# Patient Record
Sex: Male | Born: 1950
Health system: Southern US, Community
[De-identification: ages and names within clinical notes are randomized; demographics above are authoritative.]

## PROBLEM LIST (undated history)

## (undated) DIAGNOSIS — I77810 Thoracic aortic ectasia: Secondary | ICD-10-CM

## (undated) DIAGNOSIS — I4510 Unspecified right bundle-branch block: Secondary | ICD-10-CM

## (undated) DIAGNOSIS — I10 Essential (primary) hypertension: Secondary | ICD-10-CM

## (undated) HISTORY — DX: Essential (primary) hypertension: I10

## (undated) HISTORY — DX: Unspecified right bundle-branch block: I45.10

## (undated) HISTORY — DX: Thoracic aortic ectasia: I77.810

---

## 1990-01-03 HISTORY — PX: NEPHRECTOMY: SHX65

## 2004-01-15 ENCOUNTER — Ambulatory Visit: Payer: Self-pay | Admitting: Cardiology

## 2004-01-15 ENCOUNTER — Inpatient Hospital Stay (HOSPITAL_COMMUNITY): Admission: AD | Admit: 2004-01-15 | Discharge: 2004-01-17 | Payer: Self-pay | Admitting: Family Medicine

## 2004-01-16 ENCOUNTER — Ambulatory Visit: Payer: Self-pay | Admitting: *Deleted

## 2004-01-27 ENCOUNTER — Ambulatory Visit: Payer: Self-pay | Admitting: *Deleted

## 2005-01-10 ENCOUNTER — Ambulatory Visit: Payer: Self-pay | Admitting: *Deleted

## 2005-01-13 ENCOUNTER — Ambulatory Visit (HOSPITAL_COMMUNITY): Admission: RE | Admit: 2005-01-13 | Discharge: 2005-01-13 | Payer: Self-pay | Admitting: *Deleted

## 2005-01-18 ENCOUNTER — Ambulatory Visit: Payer: Self-pay | Admitting: *Deleted

## 2007-03-01 ENCOUNTER — Ambulatory Visit: Payer: Self-pay | Admitting: Internal Medicine

## 2007-03-06 ENCOUNTER — Ambulatory Visit: Payer: Self-pay | Admitting: Cardiology

## 2007-03-06 ENCOUNTER — Ambulatory Visit (HOSPITAL_COMMUNITY): Admission: RE | Admit: 2007-03-06 | Discharge: 2007-03-06 | Payer: Self-pay | Admitting: Internal Medicine

## 2008-09-09 DIAGNOSIS — I10 Essential (primary) hypertension: Secondary | ICD-10-CM | POA: Insufficient documentation

## 2009-11-02 ENCOUNTER — Ambulatory Visit: Payer: Self-pay | Admitting: Cardiology

## 2009-11-02 DIAGNOSIS — E782 Mixed hyperlipidemia: Secondary | ICD-10-CM

## 2009-11-02 DIAGNOSIS — I712 Thoracic aortic aneurysm, without rupture, unspecified: Secondary | ICD-10-CM | POA: Insufficient documentation

## 2009-11-03 ENCOUNTER — Encounter: Payer: Self-pay | Admitting: Cardiology

## 2009-11-06 ENCOUNTER — Ambulatory Visit: Payer: Self-pay | Admitting: Cardiology

## 2009-11-06 ENCOUNTER — Encounter: Payer: Self-pay | Admitting: Cardiology

## 2009-11-06 ENCOUNTER — Ambulatory Visit (HOSPITAL_COMMUNITY): Admission: RE | Admit: 2009-11-06 | Discharge: 2009-11-06 | Payer: Self-pay | Admitting: Cardiology

## 2009-11-27 ENCOUNTER — Emergency Department (HOSPITAL_COMMUNITY): Admission: EM | Admit: 2009-11-27 | Discharge: 2009-11-28 | Payer: Self-pay | Admitting: Emergency Medicine

## 2010-02-04 NOTE — Letter (Signed)
Summary: [facility] MEDICAL RECORDS  [facility]   Imported By: Faythe Ghee 11/03/2009 12:26:32  _____________________________________________________________________  External Attachment:    Type:   Image     Comment:   External Document

## 2010-02-04 NOTE — Assessment & Plan Note (Signed)
Summary: **NP6 SURVEILLANCE ANEURYSM  Medications Added ATENOLOL 100 MG TABS (ATENOLOL) take 1 tab daily ALLOPURINOL 100 MG TABS (ALLOPURINOL) take 1 tab daily SIMVASTATIN 20 MG TABS (SIMVASTATIN) take 1 tab daily CLONIDINE HCL 0.1 MG TABS (CLONIDINE HCL) take 1 tab two times a day ADVIL 200 MG TABS (IBUPROFEN) use as needed      Allergies Added: NKDA  Visit Type:  Follow-up Primary Maveric Debono:  Dr. Elfredia Nevins   History of Present Illness: 60 year old [name] presents for followup. This is my first meeting with him. He was previously seen by Dr. Tenny Craw, last in February of 2009. He has a history of a very mildly dilated aortic root at 4 cm. This has been followed by both CT scan and echocardiography over the years. It appears that the last assessment was in 2007 - I can locate no more recect studies.  He has been symptomatically stable without any chest pain or unusual dyspnea. He works long hours [employer] with his wife. He reports having lipids followed by Dr. Carlena Sax, and tolerating his present medications. He is not exercising regularly. He does seem to watch his diet, although we did about carbohydrate restriction and weight loss on today.  He would prefer to hold off serial CT scaning related to the cost and radiation, although is willing to proceed if absolutely needed.  Current Medications (verified): 1)  Atenolol 100 Mg Tabs (Atenolol) .... Take 1 Tab Daily 2)  Allopurinol 100 Mg Tabs (Allopurinol) .... Take 1 Tab Daily 3)  Simvastatin 20 Mg Tabs (Simvastatin) .... Take 1 Tab Daily 4)  Clonidine Hcl 0.1 Mg Tabs (Clonidine Hcl) .... Take 1 Tab Two Times A Day 5)  Advil 200 Mg Tabs (Ibuprofen) .... Use As Needed  Allergies (verified): No Known Drug Allergies  Comments:  Nurse/Medical Assistant: patient brought med bottles stated all meds are correct  Past History:  Social History: Last updated: 09/09/2008 Full Time Married  Tobacco Use - No.    Alcohol Use - no Regular Exercise - no Drug Use - no  Past Medical History: Hypertension Renal cell carcinoma Dilated aortic root, 40 mm Right bundle branch block  Past Surgical History: Right nephrectomy 1992  Family History: Father: deceased age 64 myocardial infarction Mother: alive age 16 Siblings: 3 brothers alive and well, 3 sisters alive and well  Review of Systems  The patient denies anorexia, fever, weight loss, chest pain, syncope, dyspnea on exertion, peripheral edema, melena, and hematochezia.         Otherwise reviewed and negative.  Vital Signs:  Patient profile:   60 year old [name] Height:      66 inches Weight:      196 pounds BMI:     31.75 Pulse rate:   54 / minute BP sitting:   164 / 79  (right arm)  Vitals Entered By: Dreama Saa, CNA (November 02, 2009 2:36 PM)  Physical Exam  Additional Exam:  Overweight [name] in no acute distress. HEENT: Conjunctiva and lids normal, oropharynx clear. Neck: Supple, no elevated JVP or bruits, no thyromegaly. Lungs: Clear to auscultation, nonlabored. Cardiac: Regular rate and rhythm, no S3 or significant murmur.  Abdomen: Soft, nontender, bowel sounds present. Skin: Warm and dry. Musculoskeletal: No kyphosis. Extremities: No pitting edema, pulses full. Neuropsychiatric: Alert and oriented x3, affect appropriate.   Nuclear Study  Procedure date:  01/15/2006  Findings:       RESULTS:  There is uniform perfusion throughout all myocardial  segments.  There is no evidence of ischemia or scar.  There is some   diaphragmatic attenuation.  The wall motion appears normal throughout   including the inferior wall.  The overall ejection fraction is 55%.   STRESS TEST: The patient exercised 9 minutes and 9 seconds of a Bruce   protocol, attaining Edward mets of exercise.  His heart rate increased   from 87 beats per minute to 136 beats per minute, which is 81% of his   max predicted heart rate for his age.  His blood  pressure went from   148/90 to 212/78.  During that time, he experienced no chest pain and   no shortness of breath.  He had no arrhythmia or ST-T wave changes.   His baseline electrocardiogram has a right bundle branch block.   Reason for stopping the test was fatigue.   IMPRESSION:   This is a low risk scan in a patient with no known coronary artery   disease.  Obviously, there is submaximal exercise, but this is due to   the patient's beta blockade.  There is, however, an excellent double   product at 28,000 and my sense is that does not interfere with the   accuracy of this study.  CT Scan  Procedure date:  01/13/2005  Findings:      IMPRESSION:   1.  Stable appearance of ectasia of the ascending thoracic aorta to   between 3.8 and 4.1 cm as described.  No complications identified.   2.  Stable appearance of the chest without evidence of acute process   or metastatic disease status-post right nephrectomy.  EKG  Procedure date:  Edward/31/2011  Findings:      Sinus bradycardia at 54 beats per minute with old rght bundle branch block pattern.  Impression & Recommendations:  Problem # 1:  ASCENDING AORTIC ANEURYSM (ICD-441.2)  Patient with mild dilatation of the aortic root and ascending aorta based on previous CT scan and echocardiogram, generally ranging from 3.8-4.1 cm. This has not been a progressive process over time. Last assessment was in 2007 based on my record review. Our plan is to proceed with a 2-D echocardiogram for repeat measurements of the aortic root and ascending aorta. If significant changes are noted in comparison to previous measurements, then we can refer him for a CT angiogram. Otherwise we will continue ultrasound surveillance and followup in one year.  Problem # 2:  HYPERTENSION (ICD-401.9)  Blood pressure is up today. Continues on medically therapy. I also discussed sodium restriction and weight loss.  His updated medication list for this problem  includes:    Atenolol 100 Mg Tabs (Atenolol) .Marland Kitchen... Take 1 tab daily    Clonidine Hcl 0.1 Mg Tabs (Clonidine hcl) .Marland Kitchen... Take 1 tab two times a day  Problem # 3:  MIXED HYPERLIPIDEMIA (ICD-272.2)  Followed by Dr. Sherwood Gambler.  His updated medication list for this problem includes:    Simvastatin 20 Mg Tabs (Simvastatin) .Marland Kitchen... Take 1 tab daily  Other Orders: 2-D Echocardiogram (2D Echo)  Patient Instructions: 1)  Your physician recommends that you schedule a follow-up appointment in: 1 year 2)  Your physician recommends that you continue on your current medications as directed. Please refer to the Current Medication list given to you today. 3)  Your physician has requested that you have an echocardiogram.  Echocardiography is a painless test that uses sound waves to create images of your heart. It provides your doctor with information about the size  and shape of your heart and how well your heart's chambers and valves are working.  This procedure takes approximately one hour. There are no restrictions for this procedure.

## 2010-05-18 NOTE — Assessment & Plan Note (Signed)
[facility]                       [place] [facility]IOLOG[facility] OFFICE NOTE   NAME:SEMAANMychael, [name]                     MRN:          [id number]  DATE:03/01/2007                            DOB:          March 26, 1950    IDENTIFICATION:  The patient is a 60 year old gentleman who was  previously seen by Dr. Dionicio Stall.  He was last seen in cardiology  clinic back in January 2007.  He has a history of a very mildly dilated  aortic root at about 4-cm on CT scan.   Since seen, the patient is followed again by Dr. Sherwood Gambler.  He continues on  his antihypertensive.  He denies shortness of breath.  No chest pain.  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  He says he does  not have time to exercise.  He likes to have salted nuts before bed.   CURRENT MEDICATIONS:  1. Catapres 0.5 b.i.d.  2. Toprol XL 100 daily.  3. Simvastatin 20.   REVIEW OF SYSTEMS:  No chest pain.  Breathing is good.   PHYSICAL EXAMINATION:  GENERAL:  The patient is in no distress.  VITAL SIGNS:  Blood pressure on arrival 140/90, on my check 142/72,  pulse 68, weight 198.  NECK:  JVP is normal.  No bruits.  LUNGS:  Clear.  CARDIAC:  Regular rate and rhythm, S1 and S2, no S3, no murmurs.  ABDOMEN:  Benign, slightly obese.  EXTREMITIES:  No edema.   IMPRESSION:  1. Aortic aneurysm.  We will set up for [name] echocardiogram to evaluate      the root.  On review of his x-ray, there was a ectasia of the      ascending aorta and no more comments beyond that.  Hopefully, we      will get a good view of this.  I would not plan another CT for now.  2. Hypertension in fair control.  Would continue.  3. Health care maintenance.  On his current dose of simvastatin, HDL      is 41, LDL 74.  Would continue.  Encouraged him to lose some weight      if possible and also to stay active and exercise.  4. Follow up in 1 year.     Pricilla Riffle, MD, [facility]  Electronically Signed   PVR/MedQ  DD:  03/01/2007  DT: 03/02/2007  Job #: [id number]   cc:   Madelin Rear. Sherwood Gambler, MD

## 2010-05-21 NOTE — H&P (Signed)
[name], [name]              ACCOUNT NO.:  [id number]   MEDICAL RECORD NO.:  [id number]         PATIENT TYPE:  INP   LOCATION:  A216                          FACILITY:  [facility]   PHYSICIAN:  Kirk Ruths, M.D.DATE OF BIRTH:  27-Dec-1950   DATE OF ADMISSION:  DATE OF DISCHARGE:  LH                                HISTORY & PHYSICAL   CHIEF COMPLAINT:  Chest discomfort.   HISTORY OF PRESENT ILLNESS:  The patient is a 60 year old male who has  history of hypertension.  Presents to the office complaining of two-day  history of chest tingling, weakness in his legs and excessive burping.  In  the office he was found to be mildly hypertensive.  Gross exam was normal.  EKG showed a new bundle branch block significantly changed from his last EKG  five years ago.  The patient also has strong family history of coronary  artery disease.  He is a nonsmoker.  He is admitted for rule out myocardial  infarction.   PAST MEDICAL HISTORY:  He is status post right nephrectomy in 1992 for renal  cell carcinoma.  He has above mentioned hypertension.  He is allergic to no  medication.  He is on Atenolol 100 mg daily as well as clonidine 0.1 twice  daily.   REVIEW OF SYSTEMS:  Denies nausea or vomiting, diaphoresis.  He does state  that for some time now he has felt a poke in his left chest upon straining.   PHYSICAL EXAMINATION:  GENERAL:  Middle aged male in no severe distress.  VITAL SIGNS:  He is afebrile, pulse 74 regular, blood pressure  150/92,  respiratory rate 20 unlabored.  HEENT:  Tympanic membranes normal.  Pupils equal to light and accommodation.  Oropharynx benign.  NECK:  Supple without jugular venous distention, bruits or thyromegaly.  LUNGS:  Clear in all areas.  HEART:  Regular sinus rhythm without murmurs, rubs or gallops.  ABDOMEN:  Soft and nontender.  EXTREMITIES:  Without clubbing or cyanosis.  NEUROLOGIC:  Grossly intact.   ASSESSMENT:  1.  Chest discomfort with EKG  changes.  2.  Hypertension.  3.  History of renal cell carcinoma.     Will   WMM/MEDQ  D:  01/15/2004  T:  01/15/2004  Job:  [id number]

## 2010-05-21 NOTE — Procedures (Signed)
NAMERIGGIN, CUTTINO NO.:  [id number]   MEDICAL RECORD NO.:  [id number]          PATIENT TYPE:  INP   LOCATION:  A216                          FACILITY:  [facility]   PHYSICIAN:  Vida Roller, M.D.   DATE OF BIRTH:  Oct 23, 1950   DATE OF PROCEDURE:  DATE OF DISCHARGE:                                    STRESS TEST   EXERCISE MYOVIEW   HISTORY:  [name] is a 60 year old male with no known coronary disease,  atypical chest discomfort whose cardiac risk factors include family history  of hypertension and mildly elevated lipids.  His cardiac enzymes are  negative x3 for acute myocardial infarction.   His baseline EKG reveals a right bundle branch block at a rate of 56 beats  per minute in a sinus rhythm.  Blood pressure is 148/90.   Patient exercised for 9 minutes and 9 seconds to BRUCE protocol stage 3 in  10.1 mets.  Maximal heart rate achieved was 136 beats per minute which is  81% predicted maximum.   Blood pressure was 212/78 which resolved down to 162/80 in recovery.  EKG  revealed no ischemic changes.  He maintained a right bundle branch block  throughout.  No arrhythmias were noted.  Patient reported no chest  discomfort and no shortness of breath.  Exercise was stopped secondary to  fatigue.   Final images and results are pending MD review.     Amy   AB/MEDQ  D:  01/16/2004  T:  01/16/2004  Job:  [id number]

## 2010-05-21 NOTE — Consult Note (Signed)
[name], [name]              ACCOUNT NO.:  [id number]   MEDICAL RECORD NO.:  [id number]          PATIENT TYPE:  INP   LOCATION:  A216                          FACILITY:  [facility]   PHYSICIAN:  [name], M.D.  DATE OF BIRTH:  1950/11/20   DATE OF CONSULTATION:  DATE OF DISCHARGE:                                   CONSULTATION   REFERRING DOCTOR:  [name].   DATE OF CONSULTATION:  January 15, 2004.   HISTORY OF PRESENT ILLNESS:  A 60 year old gentleman enjoying generally  excellent health, admitted to the hospital with chest discomfort and a newly  noted right bundle branch block.  [name] has had hypertension for a  number of years that has generally been under excellent control.  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  He has never previously been  evaluated by a cardiologist nor had any cardiac testing.  He has no past  history of significant chest discomfort.  The current episode began the day  prior to admission when he noted intermittent chest tingling and burning,  localized over the left anterior chest.  Each sensation lasted a matter of  seconds.  These waxed and waned for approximately 1-1/2 hours.  He also  noted the sensation of weakness in his legs, although he could walk normally  and pursue all of his usual activities.  He had associated eructation,  prompting him to use antacids.  He also took aspirin because of the location  of the chest discomfort and concern that he might be experiencing cardiac  problems.  His plan was to wait overnight to see how he felt in the morning.  When he had continued weakness in his legs this morning, he sought medical  attention in his physician's office.  On EKG, right bundle branch block was  present.  This had not been noted on a prior tracing from 2001.  He was  admitted for further evaluation.   There is no history diabetes.  The  patient is unaware of his lipid status.  He has not used tobacco products.   CURRENT MEDICATIONS:  1.  Atenolol 100 mg q.d.  2.  Clonidine 0.1 mg q.d.   There are no known allergies.   PAST MEDICAL HISTORY:  Notable for a nephrectomy in 1992 for neoplastic  disease performed at [facility].  Approximately 1 year ago he  suffered an episode of discomfort in his right foot that was characterized  as gout that responded to nonsteroidal medication.   SOCIAL HISTORY:  Lives in [place].  Denies excessive use of alcohol.  Married with one son and daughter who are healthy.   FAMILY HISTORY:  Mother is alive with hypertension.  The father died from  coronary disease after first requiring CABG surgery in his 58s.  He has six  siblings, none with coronary disease.   REVIEW OF SYSTEMS:  Notable for myopia requiring corrective lenses and some  minor GERD symptoms in the past.  All other systems reviewed and are  negative.   PHYSICAL EXAMINATION:  GENERAL APPEARANCE:  Pleasant, well-appearing  gentleman.  VITAL SIGNS:  Temperature 98, heart rate 60 and regular, respirations 18,  blood pressure 145/90.  Weight 188.  HEENT:  Anicteric sclerae.  Normal lids and conjunctivae.  Pupils are equal,  round and reactive to light.  NECK:  No jugular venous distention.  No carotid bruits.  ENDOCRINE:  No thyromegaly.  HEMATOPOIETIC:  No adenopathy.  LUNGS:  Clear.  CARDIAC:  Normal first and second heart sounds.  Fourth heart sound present.  ABDOMEN:  Soft and nontender.  Normal bowel sounds.  No bruits.  No  organomegaly.  EXTREMITIES:  Normal distal pulses.  No edema.  NEUROMUSCULAR:  Symmetric strength and tone.  Normal reflexes in the lower  extremities.  MUSCULOSKELETAL:  No joint deformity.   EKG:  Normal sinus rhythm.  Right bundle branch block.  Inferior J point  elevation.   Other laboratory is normal except for total CPK of 362.  However, MB and  troponin are normal.    IMPRESSION:  [name] has moderate cardiovascular risk with a history of  hypertension, a family history of early atherosclerosis and a newly noted  right bundle branch block.  His current symptoms are atypical and probably  of GI origin.  The etiology of his subjective lower extremity weakness is  uncertain.  If his renal tumor was a hypernephroma, a CT scan of the spine  might be appropriate to rule out recurrent disease.  From a cardiac  standpoint, an echocardiogram will be obtained to exclude prior inferior  myocardial infarction in light of his EKG abnormalities.  Serial cardiac  markers and EKGs will be obtained.  If negative, we will proceed with a  stress Myoview study in the morning.  If negative, no further cardiac  testing is anticipated.   We greatly appreciate referral of this very nice gentleman and will be happy  to follow him with you.     Robe   RR/MEDQ  D:  01/15/2004  T:  01/15/2004  Job:  [id number]

## 2010-05-21 NOTE — Procedures (Signed)
NAMECHETAN, MEHRING              ACCOUNT NO.:  [id number]   MEDICAL RECORD NO.:  [id number]          PATIENT TYPE:  INP   LOCATION:  A216                          FACILITY:  [facility]   PHYSICIAN:  Vida Roller, M.D.   DATE OF BIRTH:  10/14/1950   DATE OF PROCEDURE:  01/16/2004  DATE OF DISCHARGE:                                  ECHOCARDIOGRAM   TAPE NUMBER:  [id number].   TAPE COUNT:  [id number].   CLINICAL INFORMATION:  This is a gentleman, 60 years old with chest pain and  an abnormal electrocardiogram.   TECHNICAL QUALITY:  The technical quality of this study was difficulty.   M-MODE TRACINGS:  1.  The aorta is 30 mm.  2.  The left atrium is 45 mm.  3.  The septum is 13 mm.  4.  The posterior wall is 12 mm.  5.  The left ventricular diastolic dimension is 44 mm.  6.  The left ventricular systolic dimension is 30 mm.   2-D AND DOPPLER IMAGING:  1.  The left ventricle is normal size with normal systolic function.  There      are no obvious wall motion abnormal ties.  There is mild concentric left      ventricular hypertrophy.  2.  The right ventricle is normal size with normal systolic function.  3.  The left atrium is enlarged, the right atrium appears to be normal size.  4.  The aortic valve is sclerotic with no stenosis or regurgitation.  5.  The mitral valve has very mild annular calcification with trace      insufficiency.  6.  The tricuspid valve has mild insufficiency.  7.  Pulmonic valve not well seen.  8.  No pericardial effusion.  9.  Inferior vena cava not well seen.  10. The ascending aorta and the aortic root in the parasternal long axis      view appears to be dilated distal to the sinuses of Valsalva.  The      suprasternal notch views are difficult to interpret as they are      nonstandard, but there does not appear to be any dilatation beyond the      great vessels and there is no evidence of a coarctation.   RECOMMENDATIONS:  Consideration should be made,  in this gentleman with chest  pain, and an abnormal EKG for a CT scan of his chest with specific attention  to the aorta.     Trey Paula   JH/MEDQ  D:  01/16/2004  T:  01/16/2004  Job:  [id number]

## 2010-05-21 NOTE — Discharge Summary (Signed)
[name], [name]              ACCOUNT NO.:  [id number]   MEDICAL RECORD NO.:  [id number]          PATIENT TYPE:  INP   LOCATION:  A216                          FACILITY:  [facility]   PHYSICIAN:  [name], M.D.    DATE OF BIRTH:  1950/07/21   DATE OF ADMISSION:  01/15/2004  DATE OF DISCHARGE:  01/14/2006LH                                 DISCHARGE SUMMARY   DISCHARGE DIAGNOSES:  1.  Chest pain, questionable etiology, resolved.  2.  Mild dilation of aortic root, approximately 4 cm, no dissection.  3.  Cardiolite negative.  4.  History of renal cell carcinoma.  5.  Longstanding hypertension.   For details regarding admission, please refer to the admit note. Briefly,  this 60 year old male with the above history presented to the office with a  2 day history of chest tingling, weakness in his legs, as well as  irritation. He was found to be mildly hypertensive. Physical exam benign.  EKG showed new left bundle branch block, which was changed from EKG 5 years  prior. He does have a family history of coronary artery disease. He was  admitted for rule out of myocardial infarction. Further evaluation there as  indicated.   COURSE IN THE HOSPITAL:  The patient treated routinely on 2A. Rule out MI  protocol was benign. Cardiology consult was obtained. Cardiolite negative.  Echocardiogram benign except for questionable dilation of the aortic root. A  CT scan was obtained, which revealed max diameter of the ascending aorta for  approximately 4 cm. There was no dissection noted.   The patient's symptoms have completely resolved, he has remained stable  without complications and will be discharged today.   DISPOSITION:  His meds include Catapres 0.1 mg p.o. b.i.d., Toprol XL 100 q.  day, and p.r.n. Xanax.  He will be followed and treated expectedly as an  outpatient.  Dr. Dorethea [name] will follow and arrangements will be made for an  appointment next week.      MC/MEDQ  D:  01/17/2004  T:   01/17/2004  Job:  [id number]   cc:   [name], M.D.  Fax: [phone]

## 2010-12-29 ENCOUNTER — Encounter: Payer: Self-pay | Admitting: Cardiology

## 2012-04-12 ENCOUNTER — Encounter: Payer: Self-pay | Admitting: *Deleted

## 2012-04-13 ENCOUNTER — Encounter: Payer: Self-pay | Admitting: Cardiovascular Disease

## 2012-04-13 ENCOUNTER — Ambulatory Visit (HOSPITAL_COMMUNITY)
Admission: RE | Admit: 2012-04-13 | Discharge: 2012-04-13 | Disposition: A | Discharge status: Home / Self Care | Payer: BC Managed Care – PPO | Source: Ambulatory Visit | Attending: Cardiovascular Disease | Admitting: Cardiovascular Disease

## 2012-04-13 ENCOUNTER — Ambulatory Visit (INDEPENDENT_AMBULATORY_CARE_PROVIDER_SITE_OTHER): Payer: Medicare Other | Admitting: Cardiovascular Disease

## 2012-04-13 VITALS — BP 156/88 | HR 54 | Ht 66.0 in | Wt 202.0 lb

## 2012-04-13 DIAGNOSIS — R011 Cardiac murmur, unspecified: Secondary | ICD-10-CM | POA: Insufficient documentation

## 2012-04-13 DIAGNOSIS — I712 Thoracic aortic aneurysm, without rupture: Secondary | ICD-10-CM

## 2012-04-13 DIAGNOSIS — E785 Hyperlipidemia, unspecified: Secondary | ICD-10-CM | POA: Insufficient documentation

## 2012-04-13 DIAGNOSIS — I359 Nonrheumatic aortic valve disorder, unspecified: Secondary | ICD-10-CM

## 2012-04-13 DIAGNOSIS — E782 Mixed hyperlipidemia: Secondary | ICD-10-CM

## 2012-04-13 DIAGNOSIS — I350 Nonrheumatic aortic (valve) stenosis: Secondary | ICD-10-CM

## 2012-04-13 DIAGNOSIS — I4510 Unspecified right bundle-branch block: Secondary | ICD-10-CM | POA: Insufficient documentation

## 2012-04-13 DIAGNOSIS — I10 Essential (primary) hypertension: Secondary | ICD-10-CM

## 2012-04-13 DIAGNOSIS — I369 Nonrheumatic tricuspid valve disorder, unspecified: Secondary | ICD-10-CM

## 2012-04-13 NOTE — Assessment & Plan Note (Signed)
Well controlled.  Continue current medications and low sodium Dash type diet.    

## 2012-04-13 NOTE — Assessment & Plan Note (Signed)
Single kidney Would be nice to avoid CT/dye.  Can assess aorta with cardiac MRI.  No contrast or iv needed use axial IIR images and Fiesta candycane images for assessment and measurement Will discuss with tech

## 2012-04-13 NOTE — Assessment & Plan Note (Signed)
In setting of ascending aortic dilatation check echo for AV disease  Previous EF also noted to be 50-55% reevaluate

## 2012-04-13 NOTE — Progress Notes (Signed)
Patient ID: [name], male   DOB: 16-Jan-1950, 62 y.o.   MRN: [id number] 62 yo referred by Dr Sherwood Gambler.  No documented CAD.  CRF;s HTN and elevated lipids .  History of ascneding aortic ectasia.  Reviewed CT from 2007 and measures 3.8-4.1 cm No sinus of valsalva aneurysm.  Desncending thoracic aorta normal.  Last echo 2011 also reviewed EF 50-55% no valve disease.  Ao Root recorded as only 28 mm He is sedentary and runs a gas station. Mild exertional dyspnea. No chest pain.  Compliant with meds.  No other aneurysmal disease in regard to AAA or PV disease  ROS: Denies fever, malais, weight loss, blurry vision, decreased visual acuity, cough, sputum, SOB, hemoptysis, pleuritic pain, palpitaitons, heartburn, abdominal pain, melena, lower extremity edema, claudication, or rash.  All other systems reviewed and negative   General: Affect appropriate Healthy:  appears stated age HEENT: normal Neck supple with no adenopathy JVP normal no bruits no thyromegaly Lungs clear with no wheezing and good diaphragmatic motion Heart:  S1/S2 SEM ? Mild AR  murmur,rub, gallop or click PMI normal Abdomen: benighn, BS positve, no tenderness, no AAA no bruit.  No HSM or HJR Distal pulses intact with no bruits No edema Neuro non-focal Skin warm and dry No muscular weakness  Medications Current Outpatient Prescriptions  Medication Sig Dispense Refill  . allopurinol (ZYLOPRIM) 100 MG tablet Take 100 mg by mouth daily.        Marland Kitchen atenolol (TENORMIN) 100 MG tablet Take 100 mg by mouth daily.        . cloNIDine (CATAPRES) 0.1 MG tablet Take 0.1 mg by mouth. TAKES ONE AND HALF TABLET DAILY      . simvastatin (ZOCOR) 20 MG tablet Take 20 mg by mouth at bedtime.        Marland Kitchen ibuprofen (ADVIL,MOTRIN) 200 MG tablet Take 200 mg by mouth every 6 (six) hours as needed.         No current facility-administered medications for this visit.    Allergies Review of patient's allergies indicates no known allergies.  Family  History: Family History  Problem Relation Age of Onset  . Heart attack Father     Social History: History   Social History  . Marital Status: Married    Spouse Name: N/A    Number of Children: N/A  . Years of Education: N/A   Occupational History  . Full time    Social History Main Topics  . Smoking status: Former Games developer  . Smokeless tobacco: Not on file  . Alcohol Use: No  . Drug Use: No  . Sexually Active: Not on file   Other Topics Concern  . Not on file   Social History Narrative   No regular exercise    Electrocardiogram:  SR rate 55  RBBB   Assessment and Plan

## 2012-04-13 NOTE — Assessment & Plan Note (Signed)
Stable since 2011 No evidence of AV block Yearly ECG

## 2012-04-13 NOTE — Patient Instructions (Addendum)
Your physician recommends that you schedule a follow-up appointment in: POST CARDIAC MRI  Your physician has requested that you have an echocardiogram. Echocardiography is a painless test that uses sound waves to create images of your heart. It provides your doctor with information about the size and shape of your heart and how well your heart's chambers and valves are working. This procedure takes approximately one hour. There are no restrictions for this procedure.TODAY

## 2012-04-13 NOTE — Progress Notes (Signed)
*  PRELIMINARY RESULTS* Echocardiogram 2D Echocardiogram has been performed.  Conrad Blair 04/13/2012, 1:27 PM

## 2012-04-13 NOTE — Assessment & Plan Note (Signed)
Cholesterol is at goal.  Continue current dose of statin and diet Rx.  No myalgias or side effects.  F/U  LFT's in 6 months. No results found for this basename: [facility]  Labs with Dr Sherwood Gambler

## 2012-04-16 ENCOUNTER — Other Ambulatory Visit: Payer: Self-pay | Admitting: *Deleted

## 2012-04-16 DIAGNOSIS — I714 Abdominal aortic aneurysm, without rupture: Secondary | ICD-10-CM

## 2012-04-17 ENCOUNTER — Encounter: Payer: Self-pay | Admitting: Cardiovascular Disease

## 2012-04-19 ENCOUNTER — Other Ambulatory Visit: Payer: Self-pay | Admitting: *Deleted

## 2012-04-19 DIAGNOSIS — I714 Abdominal aortic aneurysm, without rupture: Secondary | ICD-10-CM

## 2012-05-02 ENCOUNTER — Other Ambulatory Visit (HOSPITAL_COMMUNITY): Payer: Medicare Other

## 2012-05-29 ENCOUNTER — Ambulatory Visit (HOSPITAL_COMMUNITY): Payer: BC Managed Care – PPO

## 2012-10-01 ENCOUNTER — Telehealth: Payer: Self-pay | Admitting: *Deleted

## 2012-10-01 NOTE — Telephone Encounter (Signed)
Message copied by Ovidio Kin on Mon Oct 01, 2012  8:22 AM ------      Message from: San Jetty      Created: Fri Sep 28, 2012  1:52 PM      Regarding: RE: PT NEEDS MRI TO BE SCHEDULED        Pt doesn't want [place] to schedule yet he wants to call us when he is ready      ----- Message -----         From: Ovidio Kin, LPN         Sent: 09/26/2012   5:16 PM           To: Faythe Ghee      Subject: PT NEEDS MRI TO BE SCHEDULED                             Please review that pt needs to have MRI performed, several reminders sent to have pt scheduled since apt noted 04-13-12,thanks        ------

## 2012-10-01 NOTE — Telephone Encounter (Signed)
FYI: this patient has been contacted several times since his OV to clarify when he wants this performed per noted pt cancelled the apt scheduled for the MRI, please note phone note below to advise the pt will inform our office when he wants to have the MRI scheduled

## 2012-12-21 ENCOUNTER — Encounter: Payer: Self-pay | Admitting: *Deleted

## 2014-09-19 ENCOUNTER — Ambulatory Visit (HOSPITAL_COMMUNITY)
Admission: RE | Admit: 2014-09-19 | Discharge: 2014-09-19 | Disposition: A | Discharge status: Home / Self Care | Payer: BLUE CROSS/BLUE SHIELD | Source: Ambulatory Visit | Attending: Internal Medicine | Admitting: Internal Medicine

## 2014-09-19 ENCOUNTER — Other Ambulatory Visit (HOSPITAL_COMMUNITY): Payer: Self-pay | Admitting: Internal Medicine

## 2014-09-19 DIAGNOSIS — R062 Wheezing: Secondary | ICD-10-CM

## 2014-09-19 DIAGNOSIS — R05 Cough: Secondary | ICD-10-CM

## 2014-09-19 DIAGNOSIS — R059 Cough, unspecified: Secondary | ICD-10-CM

## 2014-09-19 DIAGNOSIS — Z87891 Personal history of nicotine dependence: Secondary | ICD-10-CM | POA: Diagnosis not present

## 2015-05-09 ENCOUNTER — Emergency Department (HOSPITAL_BASED_OUTPATIENT_CLINIC_OR_DEPARTMENT_OTHER)
Admission: EM | Admit: 2015-05-09 | Discharge: 2015-05-09 | Disposition: A | Discharge status: Home / Self Care | Payer: BLUE CROSS/BLUE SHIELD | Attending: Emergency Medicine | Admitting: Emergency Medicine

## 2015-05-09 ENCOUNTER — Emergency Department (HOSPITAL_BASED_OUTPATIENT_CLINIC_OR_DEPARTMENT_OTHER): Payer: BLUE CROSS/BLUE SHIELD

## 2015-05-09 ENCOUNTER — Encounter (HOSPITAL_BASED_OUTPATIENT_CLINIC_OR_DEPARTMENT_OTHER): Payer: Self-pay | Admitting: *Deleted

## 2015-05-09 DIAGNOSIS — Z7951 Long term (current) use of inhaled steroids: Secondary | ICD-10-CM | POA: Diagnosis not present

## 2015-05-09 DIAGNOSIS — I10 Essential (primary) hypertension: Secondary | ICD-10-CM | POA: Diagnosis not present

## 2015-05-09 DIAGNOSIS — R05 Cough: Secondary | ICD-10-CM | POA: Diagnosis present

## 2015-05-09 DIAGNOSIS — Z79899 Other long term (current) drug therapy: Secondary | ICD-10-CM | POA: Insufficient documentation

## 2015-05-09 DIAGNOSIS — J40 Bronchitis, not specified as acute or chronic: Secondary | ICD-10-CM | POA: Insufficient documentation

## 2015-05-09 DIAGNOSIS — Z87891 Personal history of nicotine dependence: Secondary | ICD-10-CM | POA: Diagnosis not present

## 2015-05-09 MED ORDER — ALBUTEROL SULFATE (2.5 MG/3ML) 0.083% IN NEBU
5.0000 mg | INHALATION_SOLUTION | Freq: Once | RESPIRATORY_TRACT | Status: AC
  Administered 2015-05-09: 5 mg via RESPIRATORY_TRACT
  Filled 2015-05-09: qty 6

## 2015-05-09 MED ORDER — DOXYCYCLINE HYCLATE 100 MG PO CAPS: 100.0000 mg | ORAL_CAPSULE | Freq: Two times a day (BID) | ORAL | Status: DC

## 2015-05-09 MED ORDER — PREDNISONE 50 MG PO TABS
60.0000 mg | ORAL_TABLET | Freq: Once | ORAL | Status: AC
  Administered 2015-05-09: 60 mg via ORAL
  Filled 2015-05-09: qty 1

## 2015-05-09 MED ORDER — PROMETHAZINE-DM 6.25-15 MG/5ML PO SYRP: 5.0000 mL | ORAL_SOLUTION | Freq: Four times a day (QID) | ORAL | Status: DC | PRN

## 2015-05-09 MED ORDER — AEROCHAMBER PLUS FLO-VU MEDIUM MISC
1.0000 | Freq: Once | Status: DC
  Filled 2015-05-09: qty 1

## 2015-05-09 MED ORDER — IPRATROPIUM BROMIDE 0.02 % IN SOLN
0.5000 mg | RESPIRATORY_TRACT | Status: AC
  Administered 2015-05-09: 0.5 mg via RESPIRATORY_TRACT
  Filled 2015-05-09: qty 2.5

## 2015-05-09 MED ORDER — PREDNISONE 50 MG PO TABS: 50.0000 mg | ORAL_TABLET | Freq: Every day | ORAL | Status: DC

## 2015-05-09 MED ORDER — GUAIFENESIN ER 1200 MG PO TB12: 1.0000 | ORAL_TABLET | Freq: Two times a day (BID) | ORAL | Status: DC

## 2015-05-09 NOTE — ED Provider Notes (Signed)
CSN: [id number]     Arrival date & time 05/09/15  [id number] History   First MD Initiated Contact with Patient 05/09/15 [phone]     Chief Complaint  Patient presents with  . Cough     (Consider location/radiation/quality/duration/timing/severity/associated sxs/prior Treatment) HPI Patient presents to the emergency department with cough that has been ongoing for the past 8 weeks.  Patient states that it initially started with an upper respiratory illness and then progressively has continued to have cough and wheezing at night.  The patient states that his primary care doctor placed him on antibiotics, but has not seemed to resolve his symptoms.  Patient states that nothing seems make the condition better or worse.  Patient states that he is concerned because he has worse symptoms at night when he is lying down asleep Past Medical History  Diagnosis Date  . Hypertension   . Renal cell carcinoma   . Dilated aortic root ([place])   . RBBB (right bundle branch block)    Past Surgical History  Procedure Laterality Date  . Nephrectomy  1992    Right   Family History  Problem Relation Age of Onset  . Heart attack Father    Social History  Substance Use Topics  . Smoking status: Former Research scientist (life sciences)  . Smokeless tobacco: None  . Alcohol Use: No    Review of Systems All other systems negative except as documented in the HPI. All pertinent positives and negatives as reviewed in the HPI.   Allergies  Review of patient's allergies indicates no known allergies.  Home Medications   Prior to Admission medications   Medication Sig Start Date End Date Taking? Authorizing Provider  albuterol (PROVENTIL HFA;VENTOLIN HFA) 108 (90 Base) MCG/ACT inhaler Inhale into the lungs every 6 (six) hours as needed for wheezing or shortness of breath.   Yes Historical Provider, MD  omeprazole (PRILOSEC) 10 MG capsule Take 10 mg by mouth daily.   Yes Historical Provider, MD  allopurinol (ZYLOPRIM) 100 MG tablet Take 100 mg  by mouth daily.      Historical Provider, MD  atenolol (TENORMIN) 100 MG tablet Take 100 mg by mouth daily.      Historical Provider, MD  cloNIDine (CATAPRES) 0.1 MG tablet Take 0.1 mg by mouth. TAKES ONE AND HALF TABLET DAILY    Historical Provider, MD  ibuprofen (ADVIL,MOTRIN) 200 MG tablet Take 200 mg by mouth every 6 (six) hours as needed.      Historical Provider, MD  simvastatin (ZOCOR) 20 MG tablet Take 20 mg by mouth at bedtime.      Historical Provider, MD   BP 150/86 mmHg  Pulse 64  Temp(Src) 98.3 F (36.8 C) (Oral)  Resp 16  Ht 5\' 6"  (1.676 m)  Wt 83.915 kg  BMI 29.87 kg/m2  SpO2 97% Physical Exam  Constitutional: He is oriented to person, place, and time. He appears well-developed and well-nourished. No distress.  HENT:  Head: Normocephalic and atraumatic.  Mouth/Throat: Oropharynx is clear and moist.  Eyes: Pupils are equal, round, and reactive to light.  Neck: Normal range of motion. Neck supple.  Cardiovascular: Normal rate, regular rhythm and normal heart sounds.  Exam reveals no gallop and no friction rub.   No murmur heard. Pulmonary/Chest: Effort normal and breath sounds normal. No respiratory distress. He has no wheezes.  Neurological: He is alert and oriented to person, place, and time. He exhibits normal muscle tone. Coordination normal.  Skin: Skin is warm and dry. No rash noted.  No erythema.  Psychiatric: He has a normal mood and affect. His behavior is normal.  Nursing note and vitals reviewed.   ED Course  Procedures (including critical care time) Labs Review Labs Reviewed - No data to display  Imaging Review Dg Chest 2 View  05/09/2015  CLINICAL DATA:  Cough for 1-1/2 months with shortness of breath. EXAM: CHEST  2 VIEW COMPARISON:  09/19/2014 FINDINGS: Both lungs are clear. Heart and mediastinum are within normal limits. Surgical clips in the abdomen. Mild degenerative changes in the thoracic spine. No large pleural effusions. IMPRESSION: No active  cardiopulmonary disease. Electronically Signed   By: Markus Daft M.D.   On: 05/09/2015 09:45   I have personally reviewed and evaluated these images and lab results as part of my medical decision-making.   EKG Interpretation   Date/Time:  Saturday May 09 2015 09:56:57 EDT Ventricular Rate:  60 PR Interval:  182 QRS Duration: 152 QT Interval:  430 QTC Calculation: 430 R Axis:   54 Text Interpretation:  Normal sinus rhythm Right bundle branch block  Abnormal ECG No prior EKG for comparison.  Confirmed by LIU MD, Hinton Dyer  [phone]) on 05/09/2015 10:23:37 AM      MDM   Final diagnoses:  None     Patient will be referred back to his primary care doctor.  We will treat for bronchitis.  Patient agrees the plan and all questions were answered asking to follow-up as needed  Dalia Heading, PA-C 05/11/15 [place] Liu, MD 05/11/15 [phone]

## 2015-05-09 NOTE — Discharge Instructions (Signed)
Return here as needed.  Follow-up with your primary care doctor, increase your fluid intake and rest as much as possible °

## 2015-05-09 NOTE — ED Notes (Signed)
States been having a cough, more in the past month, has been having reflux, states rec some relief from "acid reducer" and using an inhaler, now not having any relief, cough has increased with wheezing

## 2015-05-09 NOTE — ED Notes (Signed)
PA-C in at bedside

## 2015-05-09 NOTE — ED Notes (Signed)
States has had some fevers and chills

## 2016-03-28 ENCOUNTER — Ambulatory Visit (HOSPITAL_COMMUNITY)
Admission: RE | Admit: 2016-03-28 | Discharge: 2016-03-28 | Disposition: A | Discharge status: Home / Self Care | Payer: Medicare Other | Source: Ambulatory Visit | Attending: Internal Medicine | Admitting: Internal Medicine

## 2016-03-28 ENCOUNTER — Other Ambulatory Visit (HOSPITAL_COMMUNITY): Payer: Self-pay | Admitting: Internal Medicine

## 2016-03-28 DIAGNOSIS — J040 Acute laryngitis: Secondary | ICD-10-CM | POA: Diagnosis not present

## 2016-03-28 DIAGNOSIS — R05 Cough: Secondary | ICD-10-CM

## 2016-03-28 DIAGNOSIS — J042 Acute laryngotracheitis: Secondary | ICD-10-CM | POA: Diagnosis not present

## 2016-03-28 DIAGNOSIS — R059 Cough, unspecified: Secondary | ICD-10-CM

## 2016-03-28 DIAGNOSIS — J45909 Unspecified asthma, uncomplicated: Secondary | ICD-10-CM | POA: Diagnosis not present

## 2016-04-11 DIAGNOSIS — Z1211 Encounter for screening for malignant neoplasm of colon: Secondary | ICD-10-CM | POA: Diagnosis not present

## 2016-05-31 DIAGNOSIS — E6609 Other obesity due to excess calories: Secondary | ICD-10-CM | POA: Diagnosis not present

## 2016-05-31 DIAGNOSIS — Z6830 Body mass index (BMI) 30.0-30.9, adult: Secondary | ICD-10-CM | POA: Diagnosis not present

## 2016-05-31 DIAGNOSIS — J209 Acute bronchitis, unspecified: Secondary | ICD-10-CM | POA: Diagnosis not present

## 2016-05-31 DIAGNOSIS — J069 Acute upper respiratory infection, unspecified: Secondary | ICD-10-CM | POA: Diagnosis not present

## 2016-08-10 ENCOUNTER — Ambulatory Visit (INDEPENDENT_AMBULATORY_CARE_PROVIDER_SITE_OTHER): Payer: Medicare Other | Admitting: Pulmonary Disease

## 2016-08-10 ENCOUNTER — Encounter: Payer: Self-pay | Admitting: Pulmonary Disease

## 2016-08-10 VITALS — BP 118/72 | HR 55 | Ht 66.0 in | Wt 191.0 lb

## 2016-08-10 DIAGNOSIS — J45909 Unspecified asthma, uncomplicated: Secondary | ICD-10-CM | POA: Insufficient documentation

## 2016-08-10 DIAGNOSIS — J984 Other disorders of lung: Secondary | ICD-10-CM

## 2016-08-10 DIAGNOSIS — J4530 Mild persistent asthma, uncomplicated: Secondary | ICD-10-CM | POA: Diagnosis not present

## 2016-08-10 DIAGNOSIS — K219 Gastro-esophageal reflux disease without esophagitis: Secondary | ICD-10-CM

## 2016-08-10 MED ORDER — FLUTICASONE PROPIONATE HFA 110 MCG/ACT IN AERO: 2.0000 | INHALATION_SPRAY | Freq: Every day | RESPIRATORY_TRACT | 2 refills | Status: AC

## 2016-08-10 NOTE — Addendum Note (Signed)
Addended by: Lorretta Harp on: 08/10/2016 02:35 PM   Modules accepted: Orders

## 2016-08-10 NOTE — Progress Notes (Signed)
Subjective:    Patient ID: [name], male    DOB: 01-18-1950, 66 y.o.   MRN: [id number]  HPI  Chief Complaint  Patient presents with  . Pulm Consult    Has a history of asthma. Chronic cough. Has been treated with antibiotics and prednisone. Cough has not responded to these medications.     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 For the last 2 years he has 5-6 episodes of wheezing that has been labeled as asthma and has been treated with prednisone and antibiotic. He states that each episode starts up with her scratching in his throat, the nevus congestion and then the full-blown wheeze. He occasionally develops yellow mucus for which she has been given antibiotics. Prednisone dosepak really seems to help but then symptoms recur after a few weeks. He has used albuterol for nocturnal wheezing but is only seems to work for less than 4 hours. He has tried his wife's allergy medication loratadine for 3 days with minimal relief  He has lived in [place] since [place] and never developed allergies. He denies seasonal variations. He denies frequent postnasal drip. He has occasional heartburn for which she has taken omeprazole in the past.  Environment-[employer] a Environmental consultant, no pets, lives with his family in [place]  Past medical history of right nephrectomy for renal cell carcinoma 04/2012 echo showed normal LV systolic function CT chest in the past has shown 4 cm ascending aortic aneurysm which has been stable   he smoked less than 5 pack years until he quit in 1981. He reports occasional exposure to passive smoke when he sits with his cousins and a smoke cigars or a hookah        Past Medical History:  Diagnosis Date  . Dilated aortic root ([place])   . Hypertension   . RBBB (right bundle branch block)   . Renal cell carcinoma      Past Surgical History:  Procedure Laterality Date  .  NEPHRECTOMY  1992   Right     No Known Allergies   Social History   Social History  . Marital status: Married    Spouse name: N/A  . Number of children: N/A  . Years of education: N/A   Occupational History  . Full time    Social History Main Topics  . Smoking status: Former Research scientist (life sciences)  . Smokeless tobacco: Never Used  . Alcohol use No  . Drug use: No  . Sexual activity: Not on file   Other Topics Concern  . Not on file   Social History Narrative   No regular exercise   Family History  Problem Relation Age of Onset  . Heart attack Father      Review of Systems  Constitutional: Negative for fever and unexpected weight change.  HENT: Negative for congestion, dental problem, ear pain, nosebleeds, postnasal drip, rhinorrhea, sinus pressure, sneezing, sore throat and trouble swallowing.   Eyes: Negative for redness and itching.  Respiratory: Positive for cough. Negative for chest tightness, shortness of breath and wheezing.   Cardiovascular: Negative for palpitations and leg swelling.  Gastrointestinal: Negative for nausea and vomiting.  Genitourinary: Negative for dysuria.  Musculoskeletal: Negative for joint swelling.  Skin: Negative for rash.  Neurological: Negative for headaches.  Hematological: Does not bruise/bleed easily.  Psychiatric/Behavioral: Negative for dysphoric mood. The patient is not nervous/anxious.        Objective:   Physical Exam  Gen. Pleasant, well-nourished, in no distress, normal affect ENT - no lesions, no post nasal drip Neck: No JVD, no thyromegaly, no carotid bruits Lungs: no use of accessory muscles, no dullness to percussion, clear without rales or rhonchi  Cardiovascular: Rhythm regular, heart sounds  normal, no murmurs or gallops, no peripheral edema Abdomen: soft and non-tender, no hepatosplenomegaly, BS normal. Musculoskeletal: No deformities, no cyanosis or clubbing Neuro:  alert, non focal        Assessment & Plan:

## 2016-08-10 NOTE — Assessment & Plan Note (Signed)
reactive airways - this may be related to something in your environment or sinus drip/ allergies or reflux  Breathing test shows lung capacity at 82%  Blood work for allergies today  Trial of Flovent 110 -2 puffs daily-rinse mouth after use This would be your maintenance inhaler  Use albuterol 2 puffs as needed only for wheezing  Call me next time you have an episode

## 2016-08-10 NOTE — Patient Instructions (Addendum)
You have reactive airways - this may be related to something in your environment or sinus drip/ allergies or reflux  Breathing test shows lung capacity at 82%  Blood work for allergies today  Trial of Flovent 110 -2 puffs daily-rinse mouth after use This would be your maintenance inhaler  Use albuterol 2 puffs as needed only for wheezing  Call me next time you have an episode

## 2016-08-10 NOTE — Assessment & Plan Note (Signed)
If unable to suppress with inhaled steroid, we will undertake empiric therapy with PPI for 6 weeks

## 2016-08-11 ENCOUNTER — Other Ambulatory Visit (INDEPENDENT_AMBULATORY_CARE_PROVIDER_SITE_OTHER): Payer: Medicare Other

## 2016-08-11 DIAGNOSIS — J4530 Mild persistent asthma, uncomplicated: Secondary | ICD-10-CM | POA: Diagnosis not present

## 2016-08-11 DIAGNOSIS — J984 Other disorders of lung: Secondary | ICD-10-CM

## 2016-08-11 LAB — CBC WITH DIFFERENTIAL/PLATELET
BASOS ABS: 0 10*3/uL (ref 0.0–0.1)
Basophils Relative: 0.5 % (ref 0.0–3.0)
EOS ABS: 0.3 10*3/uL (ref 0.0–0.7)
Eosinophils Relative: 5.2 % — ABNORMAL HIGH (ref 0.0–5.0)
HCT: 46.1 % (ref 39.0–52.0)
Hemoglobin: 14.6 g/dL (ref 13.0–17.0)
LYMPHS ABS: 1.9 10*3/uL (ref 0.7–4.0)
Lymphocytes Relative: 35.3 % (ref 12.0–46.0)
MCHC: 31.7 g/dL (ref 30.0–36.0)
MCV: 84.3 fl (ref 78.0–100.0)
MONO ABS: 0.6 10*3/uL (ref 0.1–1.0)
Monocytes Relative: 10.6 % (ref 3.0–12.0)
NEUTROS PCT: 48.4 % (ref 43.0–77.0)
Neutro Abs: 2.7 10*3/uL (ref 1.4–7.7)
Platelets: 189 10*3/uL (ref 150.0–400.0)
RBC: 5.47 Mil/uL (ref 4.22–5.81)
RDW: 15.5 % (ref 11.5–15.5)
WBC: 5.5 10*3/uL (ref 4.0–10.5)

## 2016-08-12 LAB — RESPIRATORY ALLERGY PROFILE REGION II ~~LOC~~
Allergen, A. alternata, m6: <0.1 kU/L
Allergen, C. Herbarum, M2: <0.1 kU/L
Allergen, Cedar tree, t12: <0.1 kU/L
Allergen, Comm Silver Birch, t9: <0.1 kU/L
Allergen, Cottonwood, t14: <0.1 kU/L
Allergen, D pternoyssinus,d7: <0.1 kU/L
Allergen, Mouse Urine Protein, e78: <0.1 kU/L
Allergen, Mulberry, t76: <0.1 kU/L
Allergen, Oak,t7: <0.1 kU/L
Allergen, P. notatum, m1: <0.1 kU/L
Aspergillus fumigatus, m3: <0.1 kU/L
Bermuda Grass: <0.1 kU/L
Box Elder IgE: <0.1 kU/L
Cat Dander: <0.1 kU/L
Cockroach: <0.1 kU/L
Common Ragweed: 0.23 kU/L — ABNORMAL HIGH
D. farinae: <0.1 kU/L
Dog Dander: <0.1 kU/L
Elm IgE: <0.1 kU/L
IgE (Immunoglobulin E), Serum: 22 kU/L (ref <115)
Johnson Grass: <0.1 kU/L
Pecan/Hickory Tree IgE: <0.1 kU/L
Rough Pigweed  IgE: <0.1 kU/L
Sheep Sorrel IgE: <0.1 kU/L
Timothy Grass: <0.1 kU/L

## 2016-09-08 DIAGNOSIS — Z125 Encounter for screening for malignant neoplasm of prostate: Secondary | ICD-10-CM | POA: Diagnosis not present

## 2016-09-08 DIAGNOSIS — D3002 Benign neoplasm of left kidney: Secondary | ICD-10-CM | POA: Diagnosis not present

## 2016-10-25 ENCOUNTER — Ambulatory Visit: Payer: PRIVATE HEALTH INSURANCE | Admitting: Pulmonary Disease

## 2016-11-11 ENCOUNTER — Ambulatory Visit (INDEPENDENT_AMBULATORY_CARE_PROVIDER_SITE_OTHER): Payer: Medicare Other | Admitting: Pulmonary Disease

## 2016-11-11 ENCOUNTER — Encounter: Payer: Self-pay | Admitting: Pulmonary Disease

## 2016-11-11 DIAGNOSIS — K219 Gastro-esophageal reflux disease without esophagitis: Secondary | ICD-10-CM | POA: Diagnosis not present

## 2016-11-11 DIAGNOSIS — J4530 Mild persistent asthma, uncomplicated: Secondary | ICD-10-CM

## 2016-11-11 NOTE — Patient Instructions (Signed)
Start taking flovent if wheezing or deep cough returns Call me for alternatives if too expensive

## 2016-11-11 NOTE — Assessment & Plan Note (Signed)
Pepcid as needed

## 2016-11-11 NOTE — Progress Notes (Signed)
   Subjective:    Patient ID: TAGG EUSTICE, male    DOB: Aug 29, 1950, 66 y.o.   MRN: [id number]  HPI  66 year old remote smoker  of [place] origin for FU of asthma.  5-6 episodes of wheezing and bronchospasm over the last 2 years. After his last visit he was given Flovent which she could not obtain because this was too expensive, got generic version from his family in [place] and this seemed to immediately help him.  For 3 months he did not have any flareup and he stopped using this over the last 2 weeks He feels much better than he has felt in 2 years.  His reflux symptoms are well controlled. His blood pressure is a good reading today Denies nocturnal symptoms and has not needed to use her rescue inhaler much  Environment- a convenience store, no pets, lives with his family in [place]  Past medical history of right nephrectomy for renal cell carcinoma  Significant tests/ events reviewed  04/2012 echo showed normal LV systolic function CT chest in the past has shown 4 cm ascending aortic aneurysm which has been stable     Review of Systems  neg for any significant sore throat, dysphagia, itching, sneezing, nasal congestion or excess/ purulent secretions, fever, chills, sweats, unintended wt loss, pleuritic or exertional cp, hempoptysis, orthopnea pnd or change in chronic leg swelling. Also denies presyncope, palpitations, heartburn, abdominal pain, nausea, vomiting, diarrhea or change in bowel or urinary habits, dysuria,hematuria, rash, arthralgias, visual complaints, headache, numbness weakness or ataxia.     Objective:   Physical Exam   Gen. Pleasant, well-nourished, in no distress ENT - no thrush, no post nasal drip Neck: No JVD, no thyromegaly, no carotid bruits Lungs: no use of accessory muscles, no dullness to percussion, clear without rales or rhonchi  Cardiovascular: Rhythm regular, heart sounds  normal, no murmurs or gallops, no peripheral  edema Musculoskeletal: No deformities, no cyanosis or clubbing         Assessment & Plan:

## 2016-11-11 NOTE — Assessment & Plan Note (Signed)
Trial off inhaled steroids for now  Start taking flovent if wheezing or deep cough returns Call me for alternatives if too expensive

## 2017-05-09 ENCOUNTER — Ambulatory Visit (INDEPENDENT_AMBULATORY_CARE_PROVIDER_SITE_OTHER): Payer: Medicare Other | Admitting: Pulmonary Disease

## 2017-05-09 ENCOUNTER — Encounter: Payer: Self-pay | Admitting: Pulmonary Disease

## 2017-05-09 VITALS — BP 120/72 | HR 54 | Ht 66.0 in | Wt 193.6 lb

## 2017-05-09 DIAGNOSIS — K219 Gastro-esophageal reflux disease without esophagitis: Secondary | ICD-10-CM

## 2017-05-09 DIAGNOSIS — J45909 Unspecified asthma, uncomplicated: Secondary | ICD-10-CM | POA: Diagnosis not present

## 2017-05-09 NOTE — Assessment & Plan Note (Signed)
Dietary control and nonpharmacological measures

## 2017-05-09 NOTE — Patient Instructions (Signed)
Lung function looks okay. Okay to use fluticasone on an as-needed basis.  Be mindful of reflux triggers. Call us as needed

## 2017-05-09 NOTE — Progress Notes (Signed)
   Subjective:    Patient ID: [name], male    DOB: [birth year], 67 y.o.   MRN: [id number]  HPI  67 yo remote smoker  of [place] origin for FU of asthma. 5-6 episodes of wheezing and bronchospasm , onset in 2016  Initial OV 08/2016 given Flovent >> too expensive, got generic from [place] and this seemed to immediately help him.  He has used this on and off, has been off for the last 2 weeks, longest time that he has been off fluticasone.  Overall feels well.  Has used Claritin for nasal discharge on occasion He has occasional reflux when he overeats or has alcohol  Spirometry today shows ratio of 81 and FEV1 of 99%  Past medical history of right nephrectomy for renal cell carcinoma  Significant tests/ events reviewed  04/2012 echo showed normal LV systolic function CT chest  01/2005 4 cm ascending aortic aneurysm which has been stable   Review of Systems Pt denies any significant  nasal congestion or excess secretions, fever, chills, sweats, unintended wt loss, pleuritic or exertional cp, orthopnea pnd or leg swelling.  Pt also denies any obvious fluctuation in symptoms with weather or environmental change or other alleviating or aggravating factors.     Pt denies any increase in rescue therapy over baseline, denies waking up needing it or having early am exacerbations or coughing/wheezing/ or dyspnea      Objective:   Physical Exam  Gen. Pleasant, well-nourished, in no distress ENT - no thrush, no post nasal drip Neck: No JVD, no thyromegaly, no carotid bruits Lungs: no use of accessory muscles, no dullness to percussion, clear without rales or rhonchi  Cardiovascular: Rhythm regular, heart sounds  normal, no murmurs or gallops, no peripheral edema Musculoskeletal: No deformities, no cyanosis or clubbing         Assessment & Plan:

## 2017-05-09 NOTE — Assessment & Plan Note (Signed)
Lung function looks okay. Okay to use fluticasone on an as-needed basis.  Be mindful of reflux triggers.  Does not appear to be seasonal Call us as needed

## 2018-01-01 DIAGNOSIS — I10 Essential (primary) hypertension: Secondary | ICD-10-CM | POA: Diagnosis not present

## 2018-01-01 DIAGNOSIS — J329 Chronic sinusitis, unspecified: Secondary | ICD-10-CM | POA: Diagnosis not present

## 2018-01-01 DIAGNOSIS — I712 Thoracic aortic aneurysm, without rupture: Secondary | ICD-10-CM | POA: Diagnosis not present

## 2018-01-01 DIAGNOSIS — Z6831 Body mass index (BMI) 31.0-31.9, adult: Secondary | ICD-10-CM | POA: Diagnosis not present

## 2018-01-01 DIAGNOSIS — Z1389 Encounter for screening for other disorder: Secondary | ICD-10-CM | POA: Diagnosis not present

## 2018-01-01 DIAGNOSIS — E669 Obesity, unspecified: Secondary | ICD-10-CM | POA: Diagnosis not present

## 2018-01-29 DIAGNOSIS — N400 Enlarged prostate without lower urinary tract symptoms: Secondary | ICD-10-CM | POA: Diagnosis not present

## 2018-01-29 DIAGNOSIS — N5201 Erectile dysfunction due to arterial insufficiency: Secondary | ICD-10-CM | POA: Diagnosis not present

## 2018-01-29 DIAGNOSIS — N401 Enlarged prostate with lower urinary tract symptoms: Secondary | ICD-10-CM | POA: Diagnosis not present

## 2018-01-29 DIAGNOSIS — D3002 Benign neoplasm of left kidney: Secondary | ICD-10-CM | POA: Diagnosis not present

## 2018-01-29 DIAGNOSIS — M545 Low back pain: Secondary | ICD-10-CM | POA: Diagnosis not present

## 2018-02-14 ENCOUNTER — Other Ambulatory Visit (HOSPITAL_COMMUNITY): Payer: Self-pay | Admitting: Family Medicine

## 2018-02-14 ENCOUNTER — Ambulatory Visit (HOSPITAL_COMMUNITY)
Admission: RE | Admit: 2018-02-14 | Discharge: 2018-02-14 | Disposition: A | Discharge status: Home / Self Care | Payer: Medicare Other | Source: Ambulatory Visit | Attending: Family Medicine | Admitting: Family Medicine

## 2018-02-14 DIAGNOSIS — M545 Low back pain, unspecified: Secondary | ICD-10-CM

## 2018-02-14 DIAGNOSIS — E6609 Other obesity due to excess calories: Secondary | ICD-10-CM | POA: Diagnosis not present

## 2018-02-14 DIAGNOSIS — Z6831 Body mass index (BMI) 31.0-31.9, adult: Secondary | ICD-10-CM | POA: Diagnosis not present

## 2018-02-23 DIAGNOSIS — I10 Essential (primary) hypertension: Secondary | ICD-10-CM | POA: Diagnosis not present

## 2018-02-23 DIAGNOSIS — Z6830 Body mass index (BMI) 30.0-30.9, adult: Secondary | ICD-10-CM | POA: Diagnosis not present

## 2018-02-23 DIAGNOSIS — N400 Enlarged prostate without lower urinary tract symptoms: Secondary | ICD-10-CM | POA: Diagnosis not present

## 2018-02-23 DIAGNOSIS — M545 Low back pain: Secondary | ICD-10-CM | POA: Diagnosis not present

## 2018-02-23 DIAGNOSIS — I712 Thoracic aortic aneurysm, without rupture: Secondary | ICD-10-CM | POA: Diagnosis not present

## 2018-02-23 DIAGNOSIS — M5136 Other intervertebral disc degeneration, lumbar region: Secondary | ICD-10-CM | POA: Diagnosis not present

## 2018-02-23 DIAGNOSIS — M1000 Idiopathic gout, unspecified site: Secondary | ICD-10-CM | POA: Diagnosis not present

## 2018-02-23 DIAGNOSIS — K219 Gastro-esophageal reflux disease without esophagitis: Secondary | ICD-10-CM | POA: Diagnosis not present

## 2018-02-23 DIAGNOSIS — Z0001 Encounter for general adult medical examination with abnormal findings: Secondary | ICD-10-CM | POA: Diagnosis not present

## 2018-02-23 DIAGNOSIS — J329 Chronic sinusitis, unspecified: Secondary | ICD-10-CM | POA: Diagnosis not present

## 2018-03-01 ENCOUNTER — Other Ambulatory Visit: Payer: Self-pay | Admitting: Internal Medicine

## 2018-03-01 DIAGNOSIS — M545 Low back pain, unspecified: Secondary | ICD-10-CM

## 2018-03-09 ENCOUNTER — Ambulatory Visit (HOSPITAL_COMMUNITY)
Admission: RE | Admit: 2018-03-09 | Discharge: 2018-03-09 | Disposition: A | Discharge status: Home / Self Care | Payer: Medicare Other | Source: Ambulatory Visit | Attending: Internal Medicine | Admitting: Internal Medicine

## 2018-03-09 DIAGNOSIS — M545 Low back pain, unspecified: Secondary | ICD-10-CM

## 2019-06-27 DIAGNOSIS — E6609 Other obesity due to excess calories: Secondary | ICD-10-CM | POA: Diagnosis not present

## 2019-06-27 DIAGNOSIS — Z6831 Body mass index (BMI) 31.0-31.9, adult: Secondary | ICD-10-CM | POA: Diagnosis not present

## 2019-06-27 DIAGNOSIS — Z125 Encounter for screening for malignant neoplasm of prostate: Secondary | ICD-10-CM | POA: Diagnosis not present

## 2019-06-27 DIAGNOSIS — E039 Hypothyroidism, unspecified: Secondary | ICD-10-CM | POA: Diagnosis not present

## 2019-06-27 DIAGNOSIS — Z0001 Encounter for general adult medical examination with abnormal findings: Secondary | ICD-10-CM | POA: Diagnosis not present

## 2019-06-27 DIAGNOSIS — Z1389 Encounter for screening for other disorder: Secondary | ICD-10-CM | POA: Diagnosis not present

## 2019-11-22 DIAGNOSIS — Z23 Encounter for immunization: Secondary | ICD-10-CM | POA: Diagnosis not present

## 2019-12-26 DIAGNOSIS — Z20822 Contact with and (suspected) exposure to covid-19: Secondary | ICD-10-CM | POA: Diagnosis not present

## 2020-01-04 IMAGING — MR MR LUMBAR SPINE W/O CM
4 of 5 series · 15 of 48 positions shown · non-contrast
Comparison: Radiographs dated 02/14/2018

CLINICAL DATA: Low back pain.  History of renal cell carcinoma.

EXAM:
MRI LUMBAR SPINE WITHOUT CONTRAST
TECHNIQUE: Multiplanar, multisequence MR imaging of the lumbar spine was
performed. No intravenous contrast was administered.

[Series 3: T2 · sagittal · 4.0mm · 0.71mm/px · 6 of 17 slices shown (1 of 2)]
[im 1/17]
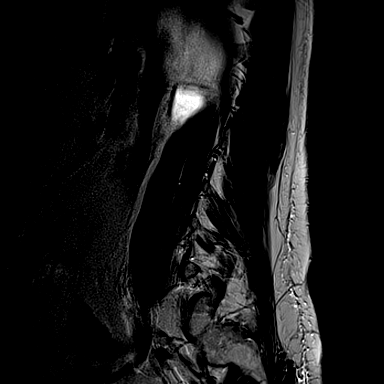
[im 4/17]
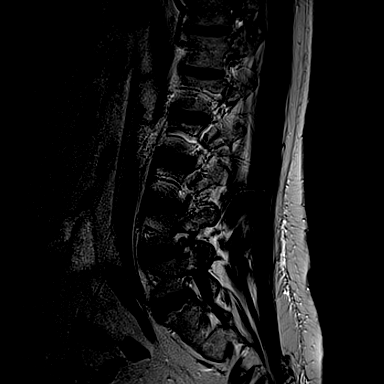
[im 7/17]
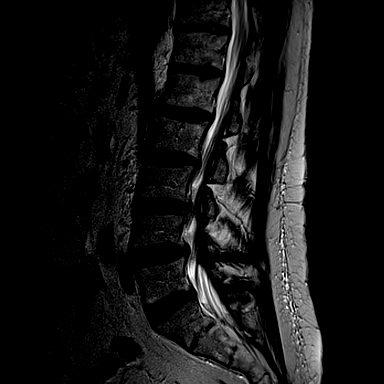
[im 10/17]
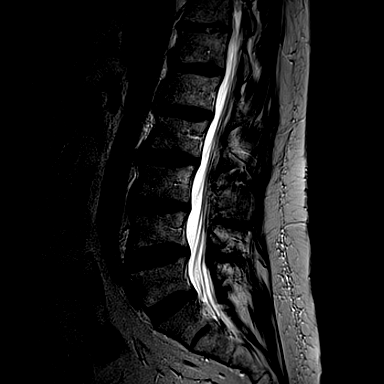
[im 13/17]
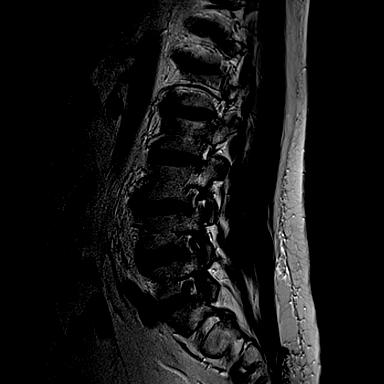
[im 17/17]
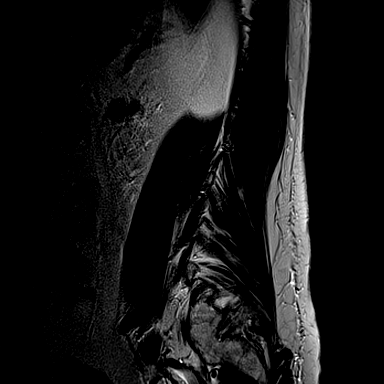

[Series 4: T1 · sagittal · 4.0mm · 0.35mm/px · 3 of 17 slices shown (1 of 2)]
[im 4/17]
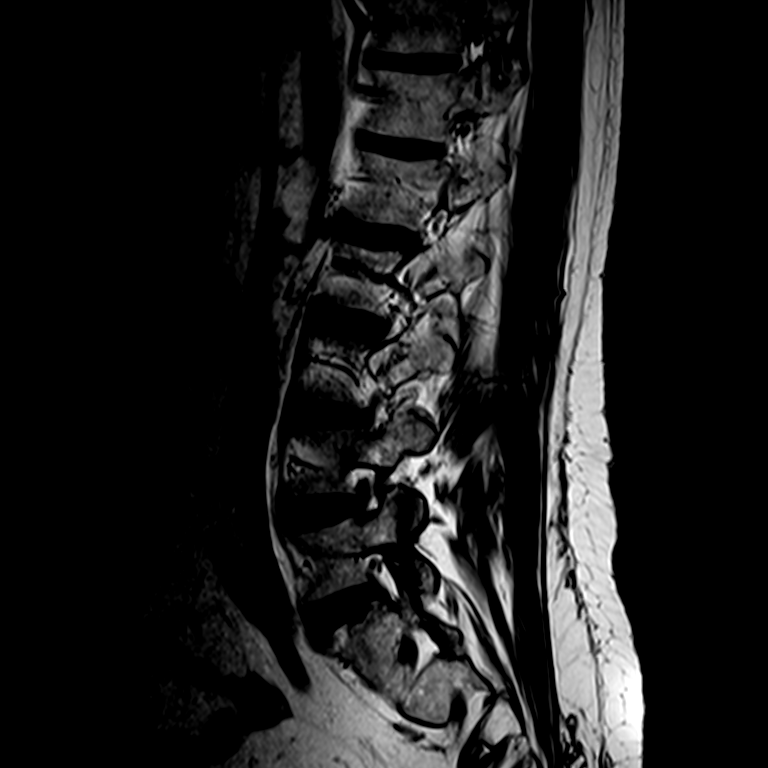
[im 10/17]
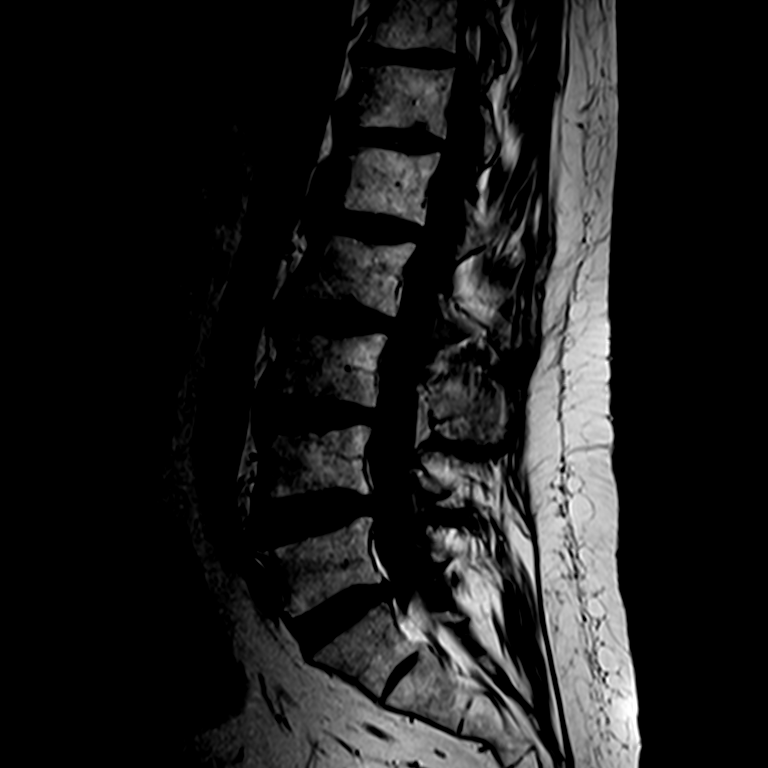
[im 17/17]
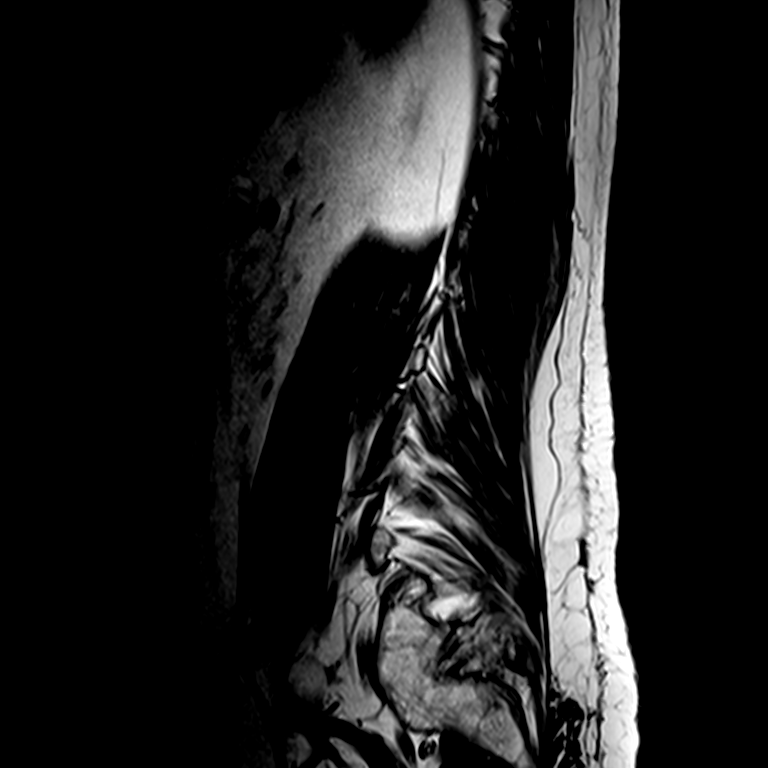

[Series 6: T2 · axial · 4.0mm · 0.27mm/px · z∈[-42,+97]mm · 3 of 42 slices shown (2 of 2)]
[im 6/42]
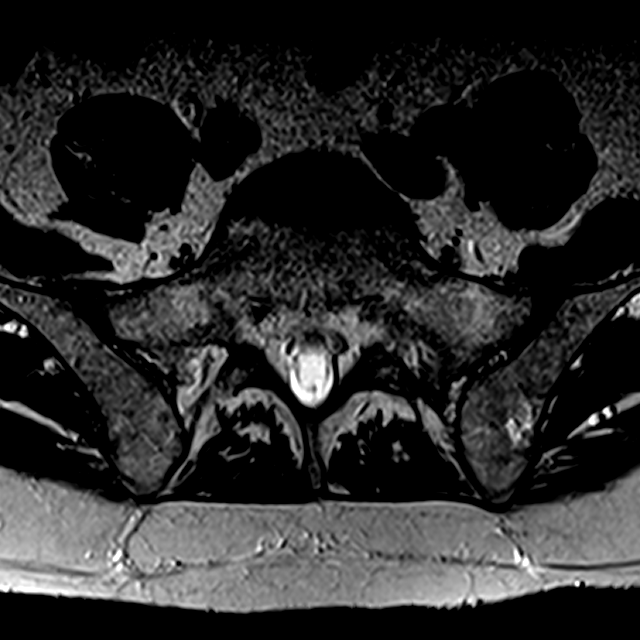
[im 21/42]
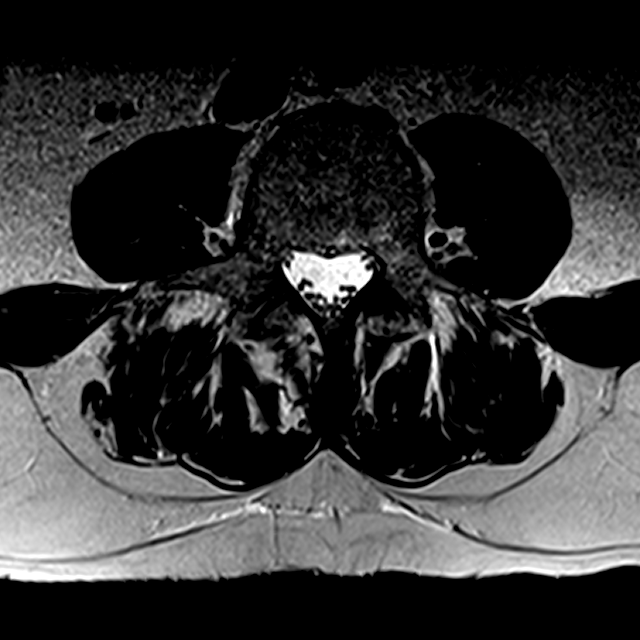
[im 36/42]
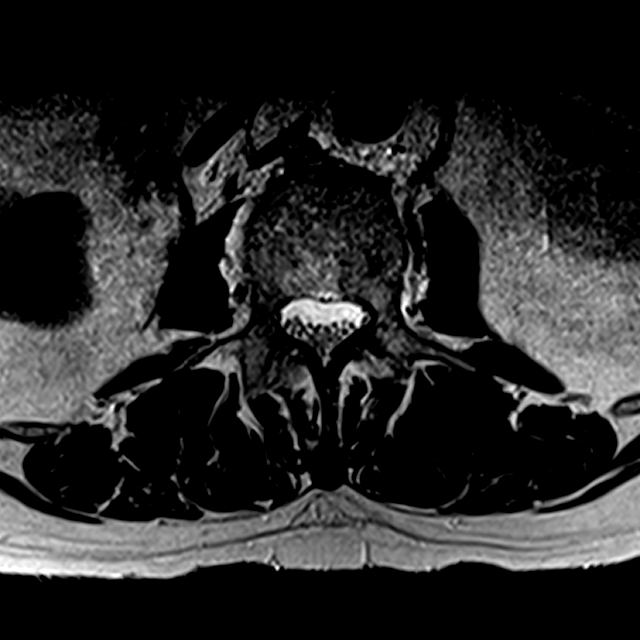

[Series 7: T1 · axial · 4.0mm · 0.27mm/px · z∈[-38,+102]mm · 3 of 43 slices shown (2 of 2)]
[im 7/43]
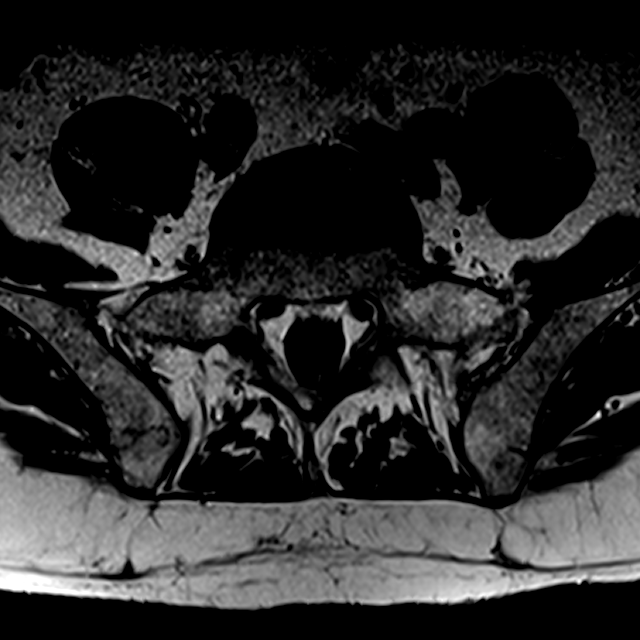
[im 22/43]
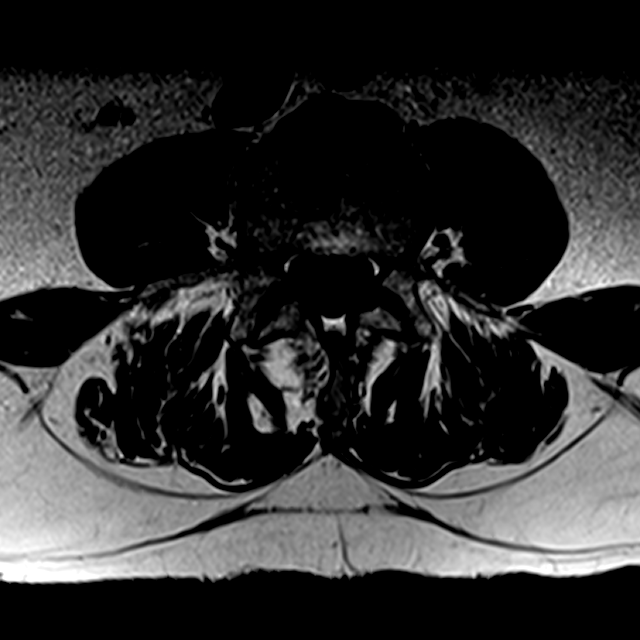
[im 37/43]
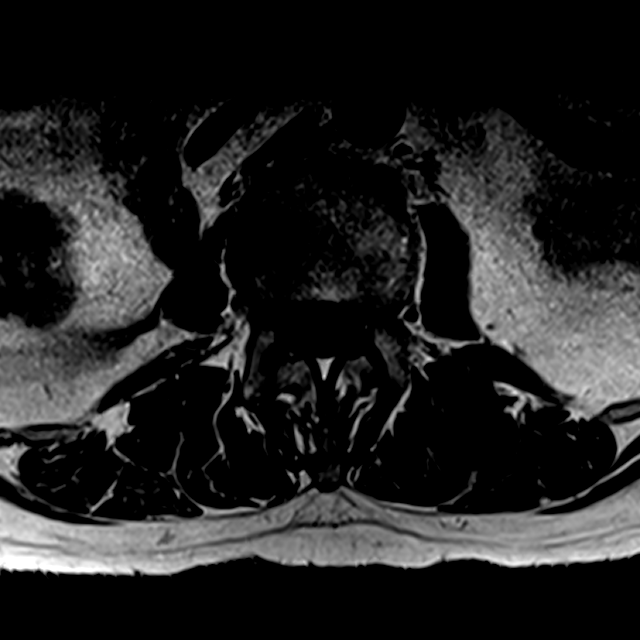

[15 of 48 positions shown; findings below may reference images not displayed]

FINDINGS: Segmentation:  Standard.

Alignment:  Physiologic.

Vertebrae:  No fracture, evidence of discitis, or bone lesion.

Conus medullaris and cauda equina: Conus extends to the L1-2 level.
Conus and cauda equina appear normal.

Paraspinal and other soft tissues: Negative.

Disc levels:

L1-2: Slight disc desiccation.  Otherwise negative.

L2-3: Slight disc desiccation.  Otherwise negative.

L3-4: Slight disc desiccation.  Otherwise negative.

L4-5: Slight disc desiccation. Tiny broad-based disc bulge slightly
asymmetric to the right with no neural impingement.

L5-S1: Disc desiccation.  Otherwise negative.

No spinal or foraminal stenosis or significant facet arthritis.
IMPRESSION: Mild diffuse degenerative disc disease. No acute abnormalities. No
neural impingement.

## 2020-07-06 DIAGNOSIS — Z20822 Contact with and (suspected) exposure to covid-19: Secondary | ICD-10-CM | POA: Diagnosis not present

## 2020-07-21 DIAGNOSIS — E6609 Other obesity due to excess calories: Secondary | ICD-10-CM | POA: Diagnosis not present

## 2020-07-21 DIAGNOSIS — Z85528 Personal history of other malignant neoplasm of kidney: Secondary | ICD-10-CM | POA: Diagnosis not present

## 2020-07-21 DIAGNOSIS — Z1389 Encounter for screening for other disorder: Secondary | ICD-10-CM | POA: Diagnosis not present

## 2020-07-21 DIAGNOSIS — Z6830 Body mass index (BMI) 30.0-30.9, adult: Secondary | ICD-10-CM | POA: Diagnosis not present

## 2020-07-21 DIAGNOSIS — Z1331 Encounter for screening for depression: Secondary | ICD-10-CM | POA: Diagnosis not present

## 2020-07-21 DIAGNOSIS — K219 Gastro-esophageal reflux disease without esophagitis: Secondary | ICD-10-CM | POA: Diagnosis not present

## 2020-07-21 DIAGNOSIS — E039 Hypothyroidism, unspecified: Secondary | ICD-10-CM | POA: Diagnosis not present

## 2020-07-21 DIAGNOSIS — Z0001 Encounter for general adult medical examination with abnormal findings: Secondary | ICD-10-CM | POA: Diagnosis not present

## 2020-07-21 DIAGNOSIS — I10 Essential (primary) hypertension: Secondary | ICD-10-CM | POA: Diagnosis not present

## 2020-07-21 DIAGNOSIS — J45909 Unspecified asthma, uncomplicated: Secondary | ICD-10-CM | POA: Diagnosis not present

## 2020-07-21 DIAGNOSIS — E782 Mixed hyperlipidemia: Secondary | ICD-10-CM | POA: Diagnosis not present

## 2020-07-21 DIAGNOSIS — Z125 Encounter for screening for malignant neoplasm of prostate: Secondary | ICD-10-CM | POA: Diagnosis not present

## 2020-08-06 DIAGNOSIS — D3002 Benign neoplasm of left kidney: Secondary | ICD-10-CM | POA: Diagnosis not present

## 2020-08-06 DIAGNOSIS — N400 Enlarged prostate without lower urinary tract symptoms: Secondary | ICD-10-CM | POA: Diagnosis not present

## 2020-08-07 DIAGNOSIS — D3002 Benign neoplasm of left kidney: Secondary | ICD-10-CM | POA: Diagnosis not present

## 2020-11-09 DIAGNOSIS — Z20828 Contact with and (suspected) exposure to other viral communicable diseases: Secondary | ICD-10-CM | POA: Diagnosis not present

## 2020-12-06 DIAGNOSIS — U071 COVID-19: Secondary | ICD-10-CM | POA: Diagnosis not present

## 2020-12-07 DIAGNOSIS — J029 Acute pharyngitis, unspecified: Secondary | ICD-10-CM | POA: Diagnosis not present

## 2020-12-31 DIAGNOSIS — U071 COVID-19: Secondary | ICD-10-CM | POA: Diagnosis not present

## 2020-12-31 DIAGNOSIS — M1000 Idiopathic gout, unspecified site: Secondary | ICD-10-CM | POA: Diagnosis not present

## 2021-02-05 DIAGNOSIS — Z20822 Contact with and (suspected) exposure to covid-19: Secondary | ICD-10-CM | POA: Diagnosis not present

## 2021-02-10 DIAGNOSIS — H25019 Cortical age-related cataract, unspecified eye: Secondary | ICD-10-CM | POA: Diagnosis not present

## 2021-02-10 DIAGNOSIS — H2513 Age-related nuclear cataract, bilateral: Secondary | ICD-10-CM | POA: Diagnosis not present

## 2021-02-10 DIAGNOSIS — H11002 Unspecified pterygium of left eye: Secondary | ICD-10-CM | POA: Diagnosis not present

## 2021-03-11 DIAGNOSIS — Z6830 Body mass index (BMI) 30.0-30.9, adult: Secondary | ICD-10-CM | POA: Diagnosis not present

## 2021-03-11 DIAGNOSIS — E6609 Other obesity due to excess calories: Secondary | ICD-10-CM | POA: Diagnosis not present

## 2021-03-11 DIAGNOSIS — I10 Essential (primary) hypertension: Secondary | ICD-10-CM | POA: Diagnosis not present

## 2021-03-11 DIAGNOSIS — M1000 Idiopathic gout, unspecified site: Secondary | ICD-10-CM | POA: Diagnosis not present

## 2021-03-11 DIAGNOSIS — E559 Vitamin D deficiency, unspecified: Secondary | ICD-10-CM | POA: Diagnosis not present

## 2021-03-11 DIAGNOSIS — E785 Hyperlipidemia, unspecified: Secondary | ICD-10-CM | POA: Diagnosis not present

## 2021-03-11 DIAGNOSIS — K219 Gastro-esophageal reflux disease without esophagitis: Secondary | ICD-10-CM | POA: Diagnosis not present

## 2021-03-23 DIAGNOSIS — Z20822 Contact with and (suspected) exposure to covid-19: Secondary | ICD-10-CM | POA: Diagnosis not present

## 2021-04-09 DIAGNOSIS — Z1152 Encounter for screening for COVID-19: Secondary | ICD-10-CM | POA: Diagnosis not present

## 2021-04-09 DIAGNOSIS — Z20828 Contact with and (suspected) exposure to other viral communicable diseases: Secondary | ICD-10-CM | POA: Diagnosis not present

## 2021-04-14 DIAGNOSIS — U071 COVID-19: Secondary | ICD-10-CM | POA: Diagnosis not present

## 2021-04-19 DIAGNOSIS — Z20822 Contact with and (suspected) exposure to covid-19: Secondary | ICD-10-CM | POA: Diagnosis not present

## 2021-04-28 DIAGNOSIS — R059 Cough, unspecified: Secondary | ICD-10-CM | POA: Diagnosis not present

## 2021-04-28 DIAGNOSIS — Z20822 Contact with and (suspected) exposure to covid-19: Secondary | ICD-10-CM | POA: Diagnosis not present

## 2021-04-28 DIAGNOSIS — R051 Acute cough: Secondary | ICD-10-CM | POA: Diagnosis not present

## 2022-04-14 DIAGNOSIS — E559 Vitamin D deficiency, unspecified: Secondary | ICD-10-CM | POA: Diagnosis not present

## 2022-04-14 DIAGNOSIS — Z0001 Encounter for general adult medical examination with abnormal findings: Secondary | ICD-10-CM | POA: Diagnosis not present

## 2022-04-14 DIAGNOSIS — J45909 Unspecified asthma, uncomplicated: Secondary | ICD-10-CM | POA: Diagnosis not present

## 2022-04-14 DIAGNOSIS — N400 Enlarged prostate without lower urinary tract symptoms: Secondary | ICD-10-CM | POA: Diagnosis not present

## 2022-04-14 DIAGNOSIS — D513 Other dietary vitamin B12 deficiency anemia: Secondary | ICD-10-CM | POA: Diagnosis not present

## 2022-04-14 DIAGNOSIS — C649 Malignant neoplasm of unspecified kidney, except renal pelvis: Secondary | ICD-10-CM | POA: Diagnosis not present

## 2022-04-14 DIAGNOSIS — K219 Gastro-esophageal reflux disease without esophagitis: Secondary | ICD-10-CM | POA: Diagnosis not present

## 2022-04-14 DIAGNOSIS — I10 Essential (primary) hypertension: Secondary | ICD-10-CM | POA: Diagnosis not present

## 2022-04-14 DIAGNOSIS — E785 Hyperlipidemia, unspecified: Secondary | ICD-10-CM | POA: Diagnosis not present

## 2022-04-14 DIAGNOSIS — Z125 Encounter for screening for malignant neoplasm of prostate: Secondary | ICD-10-CM | POA: Diagnosis not present

## 2022-04-14 DIAGNOSIS — Z6830 Body mass index (BMI) 30.0-30.9, adult: Secondary | ICD-10-CM | POA: Diagnosis not present

## 2022-04-14 DIAGNOSIS — Z1331 Encounter for screening for depression: Secondary | ICD-10-CM | POA: Diagnosis not present

## 2022-04-14 DIAGNOSIS — E6609 Other obesity due to excess calories: Secondary | ICD-10-CM | POA: Diagnosis not present

## 2022-05-04 DIAGNOSIS — I10 Essential (primary) hypertension: Secondary | ICD-10-CM | POA: Diagnosis not present

## 2022-05-04 DIAGNOSIS — J45909 Unspecified asthma, uncomplicated: Secondary | ICD-10-CM | POA: Diagnosis not present

## 2022-05-04 DIAGNOSIS — C649 Malignant neoplasm of unspecified kidney, except renal pelvis: Secondary | ICD-10-CM | POA: Diagnosis not present

## 2022-05-04 DIAGNOSIS — N1831 Chronic kidney disease, stage 3a: Secondary | ICD-10-CM | POA: Diagnosis not present

## 2022-06-16 DIAGNOSIS — N400 Enlarged prostate without lower urinary tract symptoms: Secondary | ICD-10-CM | POA: Diagnosis not present

## 2022-06-16 DIAGNOSIS — N281 Cyst of kidney, acquired: Secondary | ICD-10-CM | POA: Diagnosis not present

## 2022-06-16 DIAGNOSIS — D3002 Benign neoplasm of left kidney: Secondary | ICD-10-CM | POA: Diagnosis not present

## 2022-10-06 DIAGNOSIS — N281 Cyst of kidney, acquired: Secondary | ICD-10-CM | POA: Diagnosis not present

## 2022-10-06 DIAGNOSIS — N400 Enlarged prostate without lower urinary tract symptoms: Secondary | ICD-10-CM | POA: Diagnosis not present

## 2022-10-06 DIAGNOSIS — D3000 Benign neoplasm of unspecified kidney: Secondary | ICD-10-CM | POA: Diagnosis not present

## 2022-10-19 ENCOUNTER — Encounter (HOSPITAL_COMMUNITY): Payer: Self-pay | Admitting: *Deleted

## 2022-10-19 ENCOUNTER — Emergency Department (HOSPITAL_COMMUNITY): Payer: Medicare Other

## 2022-10-19 ENCOUNTER — Other Ambulatory Visit: Payer: Self-pay

## 2022-10-19 ENCOUNTER — Emergency Department (HOSPITAL_COMMUNITY)
Admission: EM | Admit: 2022-10-19 | Discharge: 2022-10-19 | Disposition: A | Discharge status: Home / Self Care | Payer: Medicare Other | Attending: Emergency Medicine | Admitting: Emergency Medicine

## 2022-10-19 DIAGNOSIS — S42351A Displaced comminuted fracture of shaft of humerus, right arm, initial encounter for closed fracture: Secondary | ICD-10-CM | POA: Diagnosis not present

## 2022-10-19 DIAGNOSIS — M25521 Pain in right elbow: Secondary | ICD-10-CM | POA: Insufficient documentation

## 2022-10-19 DIAGNOSIS — M25511 Pain in right shoulder: Secondary | ICD-10-CM | POA: Insufficient documentation

## 2022-10-19 DIAGNOSIS — M19011 Primary osteoarthritis, right shoulder: Secondary | ICD-10-CM | POA: Diagnosis not present

## 2022-10-19 DIAGNOSIS — S42291A Other displaced fracture of upper end of right humerus, initial encounter for closed fracture: Secondary | ICD-10-CM | POA: Diagnosis not present

## 2022-10-19 DIAGNOSIS — W010XXA Fall on same level from slipping, tripping and stumbling without subsequent striking against object, initial encounter: Secondary | ICD-10-CM | POA: Insufficient documentation

## 2022-10-19 DIAGNOSIS — Z043 Encounter for examination and observation following other accident: Secondary | ICD-10-CM | POA: Diagnosis not present

## 2022-10-19 DIAGNOSIS — S4991XA Unspecified injury of right shoulder and upper arm, initial encounter: Secondary | ICD-10-CM | POA: Diagnosis present

## 2022-10-19 MED ORDER — OXYCODONE-ACETAMINOPHEN 5-325 MG PO TABS: 1.0000 | ORAL_TABLET | Freq: Three times a day (TID) | ORAL | 0 refills | Status: AC | PRN

## 2022-10-19 MED ORDER — ACETAMINOPHEN ER 650 MG PO TBCR: 650.0000 mg | EXTENDED_RELEASE_TABLET | Freq: Four times a day (QID) | ORAL | 0 refills | Status: AC | PRN

## 2022-10-19 MED ORDER — ACETAMINOPHEN 325 MG PO TABS
650.0000 mg | ORAL_TABLET | Freq: Once | ORAL | Status: AC
  Administered 2022-10-19: 650 mg via ORAL
  Filled 2022-10-19: qty 2

## 2022-10-19 NOTE — ED Provider Triage Note (Signed)
Emergency Medicine Provider Triage Evaluation Note  [name] , a 72 y.o. male  was evaluated in triage.  Pt complains of fall on outstretched hand earlier today.  Patient had mechanical fall.  Landed and hitting some of his upper chest, mainly complaining about right shoulder.  Had some pain in the right elbow now but thinks any be referred pain.  No numbness or tingling.  Did not hit his head or neck.  He denies any blood thinner use.  Review of Systems  Positive:  Negative:   Physical Exam  BP (!) 187/83 (BP Location: Left Arm)   Pulse (!) 45   Temp 97.6 F (36.4 C) (Oral)   Resp 16   Ht 5\' 5"  (1.651 m)   Wt 84.4 kg   SpO2 99%   BMI 30.95 kg/m  Gen:   Awake, no distress   Resp:  Normal effort  MSK:   Moves extremities without difficulty  Other:  Some tenderness to the right shoulder and axilla. NV itnact distally. Compartments are soft. No back tenderness to palpation. No tenderness into the chest.   Medical Decision Making  Medically screening exam initiated at 2:38 PM.  Appropriate orders placed.  [name] was informed that the remainder of the evaluation will be completed by another provider, this initial triage assessment does not replace that evaluation, and the importance of remaining in the ED until their evaluation is complete.  XR of the shoulder, elbow, and chest ordered   Achille Rich, PA-C 10/19/22 1441

## 2022-10-19 NOTE — ED Provider Notes (Addendum)
[place] EMERGENCY DEPARTMENT AT [facility] Provider Note   CSN: [id number] Arrival date & time: 10/19/22  1353     History  Chief Complaint  Patient presents with   Shoulder Injury    [name] is a 72 y.o. male.  HPI    71 year old otherwise healthy gentleman comes to the emergency room with chief complaint of mechanical fall.  Patient states that he simply tripped, fell onto his side and tried to break his fall with his right upper extremity.  In the process he injured his right shoulder.  He has significant discomfort with any movement of the shoulder.  Review of system is negative for any numbness, tingling, shortness of breath.  Patient does have mild chest discomfort, thigh discomfort on the right side and some abrasions/road rash to the extremity.  Patient is certain that he did not strike his head.  He is not on any blood thinners.  He has ambulated since the accident.  Home Medications Prior to Admission medications   Medication Sig Start Date End Date Taking? Authorizing Provider  acetaminophen (TYLENOL 8 HOUR) 650 MG CR tablet Take 1 tablet (650 mg total) by mouth every 6 (six) hours as needed for pain. 10/19/22  Yes Derwood Kaplan, MD  oxyCODONE-acetaminophen (PERCOCET/ROXICET) 5-325 MG tablet Take 1 tablet by mouth every 8 (eight) hours as needed for severe pain (pain score 7-10). 10/19/22  Yes Derwood Kaplan, MD  allopurinol (ZYLOPRIM) 100 MG tablet Take 100 mg by mouth daily.      [provider]  atenolol (TENORMIN) 100 MG tablet Take 100 mg by mouth daily.      [provider]  cloNIDine (CATAPRES) 0.1 MG tablet Take 0.1 mg by mouth. TAKES ONE AND HALF TABLET DAILY    [provider]  fluticasone (FLOVENT HFA) 110 MCG/ACT inhaler Inhale 2 puffs into the lungs daily. 08/10/16   Oretha Milch, MD  ibuprofen (ADVIL,MOTRIN) 200 MG tablet Take 200 mg by mouth every 6 (six) hours as needed.      [provider]   omeprazole (PRILOSEC) 10 MG capsule Take 10 mg by mouth daily.    [provider]  simvastatin (ZOCOR) 20 MG tablet Take 20 mg by mouth at bedtime.      [provider]      Allergies    Patient has no known allergies.    Review of Systems   Review of Systems  All other systems reviewed and are negative.   Physical Exam Updated Vital Signs BP (!) 187/83 (BP Location: Left Arm)   Pulse (!) 45   Temp 97.6 F (36.4 C) (Oral)   Resp 16   Ht 5\' 5"  (1.651 m)   Wt 84.4 kg   SpO2 99%   BMI 30.95 kg/m  Physical Exam Vitals and nursing note reviewed.  Constitutional:      Appearance: He is well-developed.  HENT:     Head: Atraumatic.  Cardiovascular:     Rate and Rhythm: Normal rate.  Pulmonary:     Effort: Pulmonary effort is normal.     Breath sounds: Normal breath sounds.  Musculoskeletal:     Cervical back: Neck supple.     Comments: RUE in neutral position  Skin:    General: Skin is warm.  Neurological:     Mental Status: He is alert and oriented to person, place, and time.     ED Results / Procedures / Treatments   Labs (all labs  ordered are listed, but only abnormal results are displayed) Labs Reviewed - No data to display  EKG None  Radiology No results found.  Procedures Procedures    Medications Ordered in ED Medications  acetaminophen (TYLENOL) tablet 650 mg (650 mg Oral Given 10/19/22 1525)    ED Course/ Medical Decision Making/ A&P                                 Medical Decision Making Amount and/or Complexity of Data Reviewed Radiology: ordered.  Risk OTC drugs. Prescription drug management.   72 year old patient comes in after sustaining what appears to be a mechanical fall. Pertinent past medical includes -no blood thinner utilization. Collateral history provided by daughter, who is a physician and at the bedside.  Essentially, patient had a FOOSH inj. based on my history and exam, differential diagnosis  includes: -Shoulder dislocation - Long bone fractures - Contusions - Soft tissue injury -Pneumothorax  It does not appear that patient struck his head.  No neurocomplaints.  He is ambulating. No red flags suggesting elevated ICP, I feel comfortable clearing brain and C-spine clinically w/o CT scans.  Based on the initial assessment, the following workup was initiated x-ray of the chest, x-ray of the shoulder  I have independently interpreted the following imaging from the perspective of acute trauma: X-ray of the shoulder and the results indicate proximal humeral fracture, with displacement.  Results of the ER workup discussed with the patient. Plan is to put him on shoulder immobilizer and have him follow-up with orthopedic doctors.   Final Clinical Impression(s) / ED Diagnoses Final diagnoses:  Other closed displaced fracture of proximal end of right humerus, initial encounter    Rx / DC Orders ED Discharge Orders          Ordered    acetaminophen (TYLENOL 8 HOUR) 650 MG CR tablet  Every 6 hours PRN        10/19/22 1535    oxyCODONE-acetaminophen (PERCOCET/ROXICET) 5-325 MG tablet  Every 8 hours PRN        10/19/22 1535             Derwood Kaplan, MD 10/19/22 1658

## 2022-10-19 NOTE — Discharge Instructions (Addendum)
You were seen in the emergency room for shoulder injury.  X-ray confirms that you have a proximal humeral fracture/shoulder injury.  Recommend that you keep the shoulder immobilized and apply ice to the affected area.  Take Tylenol every 6 hours.  Take the stronger pain medicine only as needed.  Dr. Ranell Patrick, Dr. Aundria Rud and Dr. Rennis Chris are the shoulder specialist at [facility].  Call the orthopedic surgery team and set up an appointment in 7-10 days.

## 2022-10-19 NOTE — ED Triage Notes (Signed)
Rt shoulder injury from a trip and extended arm to prevent fall.. Pain now in rt shoulder

## 2022-10-19 NOTE — Progress Notes (Signed)
Orthopedic Tech Progress Note Patient Details:  [name] 1950-03-25 [id number]  Ortho Devices Type of Ortho Device: Shoulder immobilizer Ortho Device/Splint Location: right Ortho Device/Splint Interventions: Ordered, Application, Adjustment   Post Interventions Patient Tolerated: Well Instructions Provided: Care of device, Adjustment of device  Kizzie Fantasia 10/19/2022, 3:41 PM

## 2022-10-27 ENCOUNTER — Ambulatory Visit
Admission: RE | Admit: 2022-10-27 | Discharge: 2022-10-27 | Disposition: A | Discharge status: Home / Self Care | Payer: Medicare Other | Source: Ambulatory Visit | Attending: Orthopedic Surgery | Admitting: Orthopedic Surgery

## 2022-10-27 ENCOUNTER — Other Ambulatory Visit: Payer: Self-pay | Admitting: Orthopedic Surgery

## 2022-10-27 DIAGNOSIS — S42201A Unspecified fracture of upper end of right humerus, initial encounter for closed fracture: Secondary | ICD-10-CM | POA: Diagnosis not present

## 2022-10-27 DIAGNOSIS — M25521 Pain in right elbow: Secondary | ICD-10-CM | POA: Diagnosis not present

## 2022-10-27 DIAGNOSIS — M25511 Pain in right shoulder: Secondary | ICD-10-CM

## 2022-10-27 DIAGNOSIS — S42291A Other displaced fracture of upper end of right humerus, initial encounter for closed fracture: Secondary | ICD-10-CM | POA: Diagnosis not present

## 2022-10-31 DIAGNOSIS — S40011A Contusion of right shoulder, initial encounter: Secondary | ICD-10-CM | POA: Diagnosis not present

## 2022-10-31 DIAGNOSIS — G8918 Other acute postprocedural pain: Secondary | ICD-10-CM | POA: Diagnosis not present

## 2022-10-31 DIAGNOSIS — Y999 Unspecified external cause status: Secondary | ICD-10-CM | POA: Diagnosis not present

## 2022-10-31 DIAGNOSIS — X58XXXA Exposure to other specified factors, initial encounter: Secondary | ICD-10-CM | POA: Diagnosis not present

## 2022-10-31 DIAGNOSIS — S42231A 3-part fracture of surgical neck of right humerus, initial encounter for closed fracture: Secondary | ICD-10-CM | POA: Diagnosis not present

## 2022-11-03 DIAGNOSIS — M6281 Muscle weakness (generalized): Secondary | ICD-10-CM | POA: Diagnosis not present

## 2022-11-03 DIAGNOSIS — M25511 Pain in right shoulder: Secondary | ICD-10-CM | POA: Diagnosis not present

## 2022-11-03 DIAGNOSIS — M25611 Stiffness of right shoulder, not elsewhere classified: Secondary | ICD-10-CM | POA: Diagnosis not present

## 2022-11-08 DIAGNOSIS — M25511 Pain in right shoulder: Secondary | ICD-10-CM | POA: Diagnosis not present

## 2022-11-08 DIAGNOSIS — M25611 Stiffness of right shoulder, not elsewhere classified: Secondary | ICD-10-CM | POA: Diagnosis not present

## 2022-11-08 DIAGNOSIS — M6281 Muscle weakness (generalized): Secondary | ICD-10-CM | POA: Diagnosis not present

## 2022-11-10 DIAGNOSIS — M25511 Pain in right shoulder: Secondary | ICD-10-CM | POA: Diagnosis not present

## 2022-11-10 DIAGNOSIS — M6281 Muscle weakness (generalized): Secondary | ICD-10-CM | POA: Diagnosis not present

## 2022-11-10 DIAGNOSIS — M25611 Stiffness of right shoulder, not elsewhere classified: Secondary | ICD-10-CM | POA: Diagnosis not present

## 2022-11-15 ENCOUNTER — Other Ambulatory Visit (HOSPITAL_COMMUNITY): Payer: Self-pay | Admitting: Orthopedic Surgery

## 2022-11-15 ENCOUNTER — Ambulatory Visit (HOSPITAL_COMMUNITY)
Admission: RE | Admit: 2022-11-15 | Discharge: 2022-11-15 | Disposition: A | Discharge status: Home / Self Care | Payer: Medicare Other | Source: Ambulatory Visit | Attending: Cardiology | Admitting: Cardiology

## 2022-11-15 DIAGNOSIS — Z4789 Encounter for other orthopedic aftercare: Secondary | ICD-10-CM | POA: Diagnosis not present

## 2022-11-15 DIAGNOSIS — M79601 Pain in right arm: Secondary | ICD-10-CM

## 2022-11-18 DIAGNOSIS — M25511 Pain in right shoulder: Secondary | ICD-10-CM | POA: Diagnosis not present

## 2022-11-18 DIAGNOSIS — M25611 Stiffness of right shoulder, not elsewhere classified: Secondary | ICD-10-CM | POA: Diagnosis not present

## 2022-11-18 DIAGNOSIS — M6281 Muscle weakness (generalized): Secondary | ICD-10-CM | POA: Diagnosis not present

## 2022-11-25 DIAGNOSIS — M6281 Muscle weakness (generalized): Secondary | ICD-10-CM | POA: Diagnosis not present

## 2022-11-25 DIAGNOSIS — M25511 Pain in right shoulder: Secondary | ICD-10-CM | POA: Diagnosis not present

## 2022-11-25 DIAGNOSIS — M25611 Stiffness of right shoulder, not elsewhere classified: Secondary | ICD-10-CM | POA: Diagnosis not present

## 2022-11-30 DIAGNOSIS — M6281 Muscle weakness (generalized): Secondary | ICD-10-CM | POA: Diagnosis not present

## 2022-11-30 DIAGNOSIS — M25511 Pain in right shoulder: Secondary | ICD-10-CM | POA: Diagnosis not present

## 2022-11-30 DIAGNOSIS — M25611 Stiffness of right shoulder, not elsewhere classified: Secondary | ICD-10-CM | POA: Diagnosis not present

## 2022-12-07 DIAGNOSIS — M25611 Stiffness of right shoulder, not elsewhere classified: Secondary | ICD-10-CM | POA: Diagnosis not present

## 2022-12-07 DIAGNOSIS — M6281 Muscle weakness (generalized): Secondary | ICD-10-CM | POA: Diagnosis not present

## 2022-12-07 DIAGNOSIS — M25511 Pain in right shoulder: Secondary | ICD-10-CM | POA: Diagnosis not present

## 2022-12-13 DIAGNOSIS — Z4789 Encounter for other orthopedic aftercare: Secondary | ICD-10-CM | POA: Diagnosis not present

## 2022-12-14 DIAGNOSIS — M25511 Pain in right shoulder: Secondary | ICD-10-CM | POA: Diagnosis not present

## 2022-12-14 DIAGNOSIS — M25611 Stiffness of right shoulder, not elsewhere classified: Secondary | ICD-10-CM | POA: Diagnosis not present

## 2022-12-14 DIAGNOSIS — M6281 Muscle weakness (generalized): Secondary | ICD-10-CM | POA: Diagnosis not present

## 2022-12-21 DIAGNOSIS — M6281 Muscle weakness (generalized): Secondary | ICD-10-CM | POA: Diagnosis not present

## 2022-12-21 DIAGNOSIS — M25511 Pain in right shoulder: Secondary | ICD-10-CM | POA: Diagnosis not present

## 2022-12-21 DIAGNOSIS — M25611 Stiffness of right shoulder, not elsewhere classified: Secondary | ICD-10-CM | POA: Diagnosis not present

## 2022-12-26 DIAGNOSIS — M25611 Stiffness of right shoulder, not elsewhere classified: Secondary | ICD-10-CM | POA: Diagnosis not present

## 2022-12-26 DIAGNOSIS — M6281 Muscle weakness (generalized): Secondary | ICD-10-CM | POA: Diagnosis not present

## 2022-12-26 DIAGNOSIS — M25511 Pain in right shoulder: Secondary | ICD-10-CM | POA: Diagnosis not present

## 2022-12-29 DIAGNOSIS — M25511 Pain in right shoulder: Secondary | ICD-10-CM | POA: Diagnosis not present

## 2022-12-29 DIAGNOSIS — M25611 Stiffness of right shoulder, not elsewhere classified: Secondary | ICD-10-CM | POA: Diagnosis not present

## 2022-12-29 DIAGNOSIS — M6281 Muscle weakness (generalized): Secondary | ICD-10-CM | POA: Diagnosis not present

## 2023-01-02 DIAGNOSIS — M25511 Pain in right shoulder: Secondary | ICD-10-CM | POA: Diagnosis not present

## 2023-01-02 DIAGNOSIS — M25611 Stiffness of right shoulder, not elsewhere classified: Secondary | ICD-10-CM | POA: Diagnosis not present

## 2023-01-02 DIAGNOSIS — M6281 Muscle weakness (generalized): Secondary | ICD-10-CM | POA: Diagnosis not present

## 2023-01-06 DIAGNOSIS — M25511 Pain in right shoulder: Secondary | ICD-10-CM | POA: Diagnosis not present

## 2023-01-06 DIAGNOSIS — M6281 Muscle weakness (generalized): Secondary | ICD-10-CM | POA: Diagnosis not present

## 2023-01-06 DIAGNOSIS — M25611 Stiffness of right shoulder, not elsewhere classified: Secondary | ICD-10-CM | POA: Diagnosis not present

## 2023-01-10 DIAGNOSIS — M6281 Muscle weakness (generalized): Secondary | ICD-10-CM | POA: Diagnosis not present

## 2023-01-10 DIAGNOSIS — M25511 Pain in right shoulder: Secondary | ICD-10-CM | POA: Diagnosis not present

## 2023-01-10 DIAGNOSIS — M25611 Stiffness of right shoulder, not elsewhere classified: Secondary | ICD-10-CM | POA: Diagnosis not present

## 2023-01-13 DIAGNOSIS — M25511 Pain in right shoulder: Secondary | ICD-10-CM | POA: Diagnosis not present

## 2023-01-13 DIAGNOSIS — M6281 Muscle weakness (generalized): Secondary | ICD-10-CM | POA: Diagnosis not present

## 2023-01-13 DIAGNOSIS — M25611 Stiffness of right shoulder, not elsewhere classified: Secondary | ICD-10-CM | POA: Diagnosis not present

## 2023-01-17 DIAGNOSIS — M25611 Stiffness of right shoulder, not elsewhere classified: Secondary | ICD-10-CM | POA: Diagnosis not present

## 2023-01-17 DIAGNOSIS — M25511 Pain in right shoulder: Secondary | ICD-10-CM | POA: Diagnosis not present

## 2023-01-17 DIAGNOSIS — M6281 Muscle weakness (generalized): Secondary | ICD-10-CM | POA: Diagnosis not present

## 2023-01-19 DIAGNOSIS — M25511 Pain in right shoulder: Secondary | ICD-10-CM | POA: Diagnosis not present

## 2023-01-19 DIAGNOSIS — M25611 Stiffness of right shoulder, not elsewhere classified: Secondary | ICD-10-CM | POA: Diagnosis not present

## 2023-01-19 DIAGNOSIS — M6281 Muscle weakness (generalized): Secondary | ICD-10-CM | POA: Diagnosis not present

## 2023-01-23 DIAGNOSIS — M25511 Pain in right shoulder: Secondary | ICD-10-CM | POA: Diagnosis not present

## 2023-01-23 DIAGNOSIS — M6281 Muscle weakness (generalized): Secondary | ICD-10-CM | POA: Diagnosis not present

## 2023-01-23 DIAGNOSIS — M25611 Stiffness of right shoulder, not elsewhere classified: Secondary | ICD-10-CM | POA: Diagnosis not present

## 2023-01-27 DIAGNOSIS — M25511 Pain in right shoulder: Secondary | ICD-10-CM | POA: Diagnosis not present

## 2023-01-27 DIAGNOSIS — M6281 Muscle weakness (generalized): Secondary | ICD-10-CM | POA: Diagnosis not present

## 2023-01-27 DIAGNOSIS — M25611 Stiffness of right shoulder, not elsewhere classified: Secondary | ICD-10-CM | POA: Diagnosis not present

## 2023-01-31 DIAGNOSIS — M6281 Muscle weakness (generalized): Secondary | ICD-10-CM | POA: Diagnosis not present

## 2023-01-31 DIAGNOSIS — M25511 Pain in right shoulder: Secondary | ICD-10-CM | POA: Diagnosis not present

## 2023-01-31 DIAGNOSIS — M25611 Stiffness of right shoulder, not elsewhere classified: Secondary | ICD-10-CM | POA: Diagnosis not present

## 2023-02-02 DIAGNOSIS — M25611 Stiffness of right shoulder, not elsewhere classified: Secondary | ICD-10-CM | POA: Diagnosis not present

## 2023-02-02 DIAGNOSIS — M25511 Pain in right shoulder: Secondary | ICD-10-CM | POA: Diagnosis not present

## 2023-02-02 DIAGNOSIS — M6281 Muscle weakness (generalized): Secondary | ICD-10-CM | POA: Diagnosis not present

## 2023-02-10 DIAGNOSIS — M25511 Pain in right shoulder: Secondary | ICD-10-CM | POA: Diagnosis not present

## 2023-02-10 DIAGNOSIS — M25611 Stiffness of right shoulder, not elsewhere classified: Secondary | ICD-10-CM | POA: Diagnosis not present

## 2023-02-10 DIAGNOSIS — M6281 Muscle weakness (generalized): Secondary | ICD-10-CM | POA: Diagnosis not present

## 2023-02-15 DIAGNOSIS — M25611 Stiffness of right shoulder, not elsewhere classified: Secondary | ICD-10-CM | POA: Diagnosis not present

## 2023-02-15 DIAGNOSIS — M25511 Pain in right shoulder: Secondary | ICD-10-CM | POA: Diagnosis not present

## 2023-02-15 DIAGNOSIS — M6281 Muscle weakness (generalized): Secondary | ICD-10-CM | POA: Diagnosis not present

## 2023-02-22 DIAGNOSIS — H25013 Cortical age-related cataract, bilateral: Secondary | ICD-10-CM | POA: Diagnosis not present

## 2023-02-22 DIAGNOSIS — H11002 Unspecified pterygium of left eye: Secondary | ICD-10-CM | POA: Diagnosis not present

## 2023-03-02 DIAGNOSIS — M6281 Muscle weakness (generalized): Secondary | ICD-10-CM | POA: Diagnosis not present

## 2023-03-02 DIAGNOSIS — M25611 Stiffness of right shoulder, not elsewhere classified: Secondary | ICD-10-CM | POA: Diagnosis not present

## 2023-03-02 DIAGNOSIS — M25511 Pain in right shoulder: Secondary | ICD-10-CM | POA: Diagnosis not present

## 2023-03-07 DIAGNOSIS — Z4789 Encounter for other orthopedic aftercare: Secondary | ICD-10-CM | POA: Diagnosis not present

## 2023-03-10 DIAGNOSIS — M25511 Pain in right shoulder: Secondary | ICD-10-CM | POA: Diagnosis not present

## 2023-03-10 DIAGNOSIS — M25611 Stiffness of right shoulder, not elsewhere classified: Secondary | ICD-10-CM | POA: Diagnosis not present

## 2023-03-10 DIAGNOSIS — M6281 Muscle weakness (generalized): Secondary | ICD-10-CM | POA: Diagnosis not present

## 2023-03-14 DIAGNOSIS — M25511 Pain in right shoulder: Secondary | ICD-10-CM | POA: Diagnosis not present

## 2023-03-14 DIAGNOSIS — M6281 Muscle weakness (generalized): Secondary | ICD-10-CM | POA: Diagnosis not present

## 2023-03-14 DIAGNOSIS — M25611 Stiffness of right shoulder, not elsewhere classified: Secondary | ICD-10-CM | POA: Diagnosis not present

## 2023-03-16 DIAGNOSIS — M25511 Pain in right shoulder: Secondary | ICD-10-CM | POA: Diagnosis not present

## 2023-03-16 DIAGNOSIS — M6281 Muscle weakness (generalized): Secondary | ICD-10-CM | POA: Diagnosis not present

## 2023-03-16 DIAGNOSIS — M25611 Stiffness of right shoulder, not elsewhere classified: Secondary | ICD-10-CM | POA: Diagnosis not present

## 2023-03-20 DIAGNOSIS — M25611 Stiffness of right shoulder, not elsewhere classified: Secondary | ICD-10-CM | POA: Diagnosis not present

## 2023-03-20 DIAGNOSIS — M25511 Pain in right shoulder: Secondary | ICD-10-CM | POA: Diagnosis not present

## 2023-03-20 DIAGNOSIS — M6281 Muscle weakness (generalized): Secondary | ICD-10-CM | POA: Diagnosis not present

## 2023-03-22 DIAGNOSIS — M25611 Stiffness of right shoulder, not elsewhere classified: Secondary | ICD-10-CM | POA: Diagnosis not present

## 2023-03-22 DIAGNOSIS — M6281 Muscle weakness (generalized): Secondary | ICD-10-CM | POA: Diagnosis not present

## 2023-03-22 DIAGNOSIS — M25511 Pain in right shoulder: Secondary | ICD-10-CM | POA: Diagnosis not present

## 2023-03-27 DIAGNOSIS — M6281 Muscle weakness (generalized): Secondary | ICD-10-CM | POA: Diagnosis not present

## 2023-03-27 DIAGNOSIS — M25511 Pain in right shoulder: Secondary | ICD-10-CM | POA: Diagnosis not present

## 2023-03-27 DIAGNOSIS — M25611 Stiffness of right shoulder, not elsewhere classified: Secondary | ICD-10-CM | POA: Diagnosis not present

## 2023-03-29 DIAGNOSIS — M6281 Muscle weakness (generalized): Secondary | ICD-10-CM | POA: Diagnosis not present

## 2023-03-29 DIAGNOSIS — M25611 Stiffness of right shoulder, not elsewhere classified: Secondary | ICD-10-CM | POA: Diagnosis not present

## 2023-03-29 DIAGNOSIS — M25511 Pain in right shoulder: Secondary | ICD-10-CM | POA: Diagnosis not present

## 2023-04-19 DIAGNOSIS — M25611 Stiffness of right shoulder, not elsewhere classified: Secondary | ICD-10-CM | POA: Diagnosis not present

## 2023-04-19 DIAGNOSIS — M25511 Pain in right shoulder: Secondary | ICD-10-CM | POA: Diagnosis not present

## 2023-04-19 DIAGNOSIS — M6281 Muscle weakness (generalized): Secondary | ICD-10-CM | POA: Diagnosis not present

## 2023-05-10 DIAGNOSIS — M545 Low back pain, unspecified: Secondary | ICD-10-CM | POA: Diagnosis not present

## 2023-05-10 DIAGNOSIS — Z4789 Encounter for other orthopedic aftercare: Secondary | ICD-10-CM | POA: Diagnosis not present

## 2023-07-31 DIAGNOSIS — H10812 Pingueculitis, left eye: Secondary | ICD-10-CM | POA: Diagnosis not present

## 2023-07-31 DIAGNOSIS — H16002 Unspecified corneal ulcer, left eye: Secondary | ICD-10-CM | POA: Diagnosis not present

## 2023-07-31 DIAGNOSIS — H11052 Peripheral pterygium, progressive, left eye: Secondary | ICD-10-CM | POA: Diagnosis not present

## 2023-08-02 DIAGNOSIS — H10812 Pingueculitis, left eye: Secondary | ICD-10-CM | POA: Diagnosis not present

## 2023-08-02 DIAGNOSIS — H11052 Peripheral pterygium, progressive, left eye: Secondary | ICD-10-CM | POA: Diagnosis not present

## 2023-08-02 DIAGNOSIS — H16002 Unspecified corneal ulcer, left eye: Secondary | ICD-10-CM | POA: Diagnosis not present

## 2023-08-07 DIAGNOSIS — Z131 Encounter for screening for diabetes mellitus: Secondary | ICD-10-CM | POA: Diagnosis not present

## 2023-08-07 DIAGNOSIS — N529 Male erectile dysfunction, unspecified: Secondary | ICD-10-CM | POA: Diagnosis not present

## 2023-08-07 DIAGNOSIS — M1A00X0 Idiopathic chronic gout, unspecified site, without tophus (tophi): Secondary | ICD-10-CM | POA: Diagnosis not present

## 2023-08-07 DIAGNOSIS — E559 Vitamin D deficiency, unspecified: Secondary | ICD-10-CM | POA: Diagnosis not present

## 2023-08-07 DIAGNOSIS — Z85528 Personal history of other malignant neoplasm of kidney: Secondary | ICD-10-CM | POA: Diagnosis not present

## 2023-08-07 DIAGNOSIS — E782 Mixed hyperlipidemia: Secondary | ICD-10-CM | POA: Diagnosis not present

## 2023-08-07 DIAGNOSIS — I10 Essential (primary) hypertension: Secondary | ICD-10-CM | POA: Diagnosis not present

## 2023-08-07 DIAGNOSIS — E66811 Obesity, class 1: Secondary | ICD-10-CM | POA: Diagnosis not present

## 2023-09-11 DIAGNOSIS — Z85528 Personal history of other malignant neoplasm of kidney: Secondary | ICD-10-CM | POA: Diagnosis not present

## 2023-09-11 DIAGNOSIS — Z1331 Encounter for screening for depression: Secondary | ICD-10-CM | POA: Diagnosis not present

## 2023-09-11 DIAGNOSIS — Z0000 Encounter for general adult medical examination without abnormal findings: Secondary | ICD-10-CM | POA: Diagnosis not present

## 2023-09-11 DIAGNOSIS — E782 Mixed hyperlipidemia: Secondary | ICD-10-CM | POA: Diagnosis not present

## 2023-09-11 DIAGNOSIS — I10 Essential (primary) hypertension: Secondary | ICD-10-CM | POA: Diagnosis not present

## 2023-10-04 DIAGNOSIS — I10 Essential (primary) hypertension: Secondary | ICD-10-CM | POA: Diagnosis not present

## 2023-10-11 DIAGNOSIS — I10 Essential (primary) hypertension: Secondary | ICD-10-CM | POA: Diagnosis not present

## 2023-10-11 DIAGNOSIS — Z85528 Personal history of other malignant neoplasm of kidney: Secondary | ICD-10-CM | POA: Diagnosis not present

## 2023-10-26 DIAGNOSIS — H11052 Peripheral pterygium, progressive, left eye: Secondary | ICD-10-CM | POA: Diagnosis not present

## 2023-10-26 DIAGNOSIS — H02831 Dermatochalasis of right upper eyelid: Secondary | ICD-10-CM | POA: Diagnosis not present

## 2023-10-26 DIAGNOSIS — H02834 Dermatochalasis of left upper eyelid: Secondary | ICD-10-CM | POA: Diagnosis not present

## 2024-01-07 ENCOUNTER — Other Ambulatory Visit: Payer: Self-pay

## 2024-01-07 ENCOUNTER — Encounter (HOSPITAL_BASED_OUTPATIENT_CLINIC_OR_DEPARTMENT_OTHER): Payer: Self-pay

## 2024-01-07 ENCOUNTER — Emergency Department (HOSPITAL_BASED_OUTPATIENT_CLINIC_OR_DEPARTMENT_OTHER): Admitting: Radiology

## 2024-01-07 ENCOUNTER — Emergency Department (HOSPITAL_BASED_OUTPATIENT_CLINIC_OR_DEPARTMENT_OTHER)
Admission: EM | Admit: 2024-01-07 | Discharge: 2024-01-07 | Disposition: A | Discharge status: Home / Self Care | Attending: Emergency Medicine | Admitting: Emergency Medicine

## 2024-01-07 DIAGNOSIS — J189 Pneumonia, unspecified organism: Secondary | ICD-10-CM | POA: Insufficient documentation

## 2024-01-07 DIAGNOSIS — Z7951 Long term (current) use of inhaled steroids: Secondary | ICD-10-CM | POA: Insufficient documentation

## 2024-01-07 DIAGNOSIS — J45901 Unspecified asthma with (acute) exacerbation: Secondary | ICD-10-CM | POA: Insufficient documentation

## 2024-01-07 DIAGNOSIS — R059 Cough, unspecified: Secondary | ICD-10-CM | POA: Diagnosis present

## 2024-01-07 MED ORDER — PREDNISONE 20 MG PO TABS: 60.0000 mg | ORAL_TABLET | Freq: Every day | ORAL | 0 refills | Status: AC

## 2024-01-07 MED ORDER — DOXYCYCLINE HYCLATE 100 MG PO TABS
100.0000 mg | ORAL_TABLET | Freq: Once | ORAL | Status: AC
  Administered 2024-01-07: 100 mg via ORAL
  Filled 2024-01-07: qty 1

## 2024-01-07 MED ORDER — DOXYCYCLINE HYCLATE 100 MG PO CAPS: 100.0000 mg | ORAL_CAPSULE | Freq: Two times a day (BID) | ORAL | 0 refills | Status: AC

## 2024-01-07 MED ORDER — PREDNISONE 50 MG PO TABS
60.0000 mg | ORAL_TABLET | Freq: Once | ORAL | Status: AC
  Administered 2024-01-07: 60 mg via ORAL
  Filled 2024-01-07: qty 1

## 2024-01-07 MED ORDER — AMOXICILLIN-POT CLAVULANATE 875-125 MG PO TABS: 1.0000 | ORAL_TABLET | Freq: Two times a day (BID) | ORAL | 0 refills | Status: AC

## 2024-01-07 MED ORDER — ALBUTEROL SULFATE HFA 108 (90 BASE) MCG/ACT IN AERS
2.0000 | INHALATION_SPRAY | Freq: Once | RESPIRATORY_TRACT | Status: AC
  Administered 2024-01-07: 2 via RESPIRATORY_TRACT
  Filled 2024-01-07: qty 6.7

## 2024-01-07 MED ORDER — AMOXICILLIN-POT CLAVULANATE 875-125 MG PO TABS
1.0000 | ORAL_TABLET | Freq: Once | ORAL | Status: AC
  Administered 2024-01-07: 1 via ORAL
  Filled 2024-01-07: qty 1

## 2024-01-07 NOTE — ED Triage Notes (Signed)
 Pt advises flu+ 2 Fridays ago, thought I was getting better, still SHOB.

## 2024-01-07 NOTE — Discharge Instructions (Signed)
 Take antibiotic and steroids as prescribed.  Use 2 to 4 puffs of your albuterol  inhaler every 6 hours as needed.  Return if symptoms worsen as we discussed

## 2024-01-07 NOTE — ED Provider Notes (Signed)
 " [name] EMERGENCY DEPARTMENT AT [facility] Provider Note   CSN: [id number] Arrival date & time: 01/07/24  8195     Patient presents with: Influenza   [name] is a 74 y.o. male.   Patient here with cough shortness of breath.  History of asthma.  Had the flu about 2 weeks ago still feels symptomatic especially with exertion.  He is been checking his oxygen at home and has been above 90%.  He denies any weakness numbness tingling.  He is had fever as well.  He is been taking Tylenol .  He is not on any chemotherapy or immunosuppressants.  Denies weakness numbness tingling.  No nausea vomiting diarrhea.  The history is provided by the patient.       Prior to Admission medications  Medication Sig Start Date End Date Taking? Authorizing Provider  amoxicillin -clavulanate (AUGMENTIN ) 875-125 MG tablet Take 1 tablet by mouth every 12 (twelve) hours for 7 days. 01/07/24 01/14/24 Yes Imya Mance, DO  doxycycline  (VIBRAMYCIN ) 100 MG capsule Take 1 capsule (100 mg total) by mouth 2 (two) times daily. 01/07/24  Yes Jerrilynn Mikowski, DO  predniSONE  (DELTASONE ) 20 MG tablet Take 3 tablets (60 mg total) by mouth daily for 4 days. 01/07/24 01/11/24 Yes Charlestine Rookstool, DO  acetaminophen  (TYLENOL  8 HOUR) 650 MG CR tablet Take 1 tablet (650 mg total) by mouth every 6 (six) hours as needed for pain. 10/19/22   Charlyn Sora, MD  allopurinol (ZYLOPRIM) 100 MG tablet Take 100 mg by mouth daily.      [provider]  atenolol (TENORMIN) 100 MG tablet Take 100 mg by mouth daily.      [provider]  cloNIDine (CATAPRES) 0.1 MG tablet Take 0.1 mg by mouth. TAKES ONE AND HALF TABLET DAILY    [provider]  fluticasone  (FLOVENT  HFA) 110 MCG/ACT inhaler Inhale 2 puffs into the lungs daily. 08/10/16   Alva, Rakesh V, MD  ibuprofen (ADVIL,MOTRIN) 200 MG tablet Take 200 mg by mouth every 6 (six) hours as needed.      [provider]  omeprazole (PRILOSEC) 10 MG  capsule Take 10 mg by mouth daily.    [provider]  oxyCODONE -acetaminophen  (PERCOCET/ROXICET) 5-325 MG tablet Take 1 tablet by mouth every 8 (eight) hours as needed for severe pain (pain score 7-10). 10/19/22   Charlyn Sora, MD  simvastatin (ZOCOR) 20 MG tablet Take 20 mg by mouth at bedtime.      [provider]    Allergies: Patient has no known allergies.    Review of Systems  Updated Vital Signs BP 107/65   Pulse 84   Temp 98.2 F (36.8 C)   Resp 16   SpO2 95%   Physical Exam Vitals and nursing note reviewed.  Constitutional:      General: He is not in acute distress.    Appearance: He is well-developed. He is not ill-appearing.  HENT:     Head: Normocephalic and atraumatic.     Nose: Nose normal.     Mouth/Throat:     Mouth: Mucous membranes are moist.  Eyes:     Extraocular Movements: Extraocular movements intact.     Conjunctiva/sclera: Conjunctivae normal.     Pupils: Pupils are equal, round, and reactive to light.  Cardiovascular:     Rate and Rhythm: Normal rate and regular rhythm.     Pulses: Normal pulses.     Heart sounds: Normal heart sounds. No murmur heard. Pulmonary:  Effort: Pulmonary effort is normal. No respiratory distress.     Breath sounds: Normal breath sounds.  Abdominal:     General: Abdomen is flat.     Palpations: Abdomen is soft.     Tenderness: There is no abdominal tenderness.  Musculoskeletal:        General: No swelling.     Cervical back: Normal range of motion and neck supple.  Skin:    General: Skin is warm and dry.     Capillary Refill: Capillary refill takes less than 2 seconds.  Neurological:     General: No focal deficit present.     Mental Status: He is alert.  Psychiatric:        Mood and Affect: Mood normal.     (all labs ordered are listed, but only abnormal results are displayed) Labs Reviewed - No data to display  EKG: None  Radiology: DG Chest 2 View Result Date: 01/07/2024 EXAM:  2 VIEW(S) XRAY OF THE CHEST 01/07/2024 06:27:02 PM COMPARISON: 10/19/2022 CLINICAL HISTORY: r/o pneumonia FINDINGS: LUNGS AND PLEURA: Emphysematous changes in the lungs. New linear infiltrates in the left base. This could represent atelectasis or pneumonia. Right lung is clear. No pleural effusion. No pneumothorax. HEART AND MEDIASTINUM: Heart size and pulmonary vascularity are normal. BONES AND SOFT TISSUES: Surgical clips in the right upper quadrant. Postoperative changes in the right shoulder. No acute osseous abnormality. IMPRESSION: 1. New linear infiltrates in the left base, possibly representing atelectasis or pneumonia. Electronically signed by: Elsie Gravely MD 01/07/2024 06:44 PM EST RP Workstation: [id number]     Procedures   Medications Ordered in the ED  predniSONE  (DELTASONE ) tablet 60 mg (has no administration in time range)  albuterol  (VENTOLIN  HFA) 108 (90 Base) MCG/ACT inhaler 2 puff (has no administration in time range)  amoxicillin -clavulanate (AUGMENTIN ) 875-125 MG per tablet 1 tablet (has no administration in time range)  doxycycline  (VIBRA -TABS) tablet 100 mg (has no administration in time range)                                    Medical Decision Making Amount and/or Complexity of Data Reviewed Radiology: ordered.  Risk Prescription drug management.   [name] is here with cough and fever.  Had the flu here recently has not really quite gotten over it.  He looks well otherwise.  Normal oxygen both at rest and with ambulation.  X-ray shows possible pneumonia and overall we will treat with antibiotics and steroids for suspected postviral pneumonia asthma exacerbation but I have no concern for sepsis.  He is very well-appearing.  He has no fever tachycardia or hypoxia here.  I think he is safe for outpatient management at this time but he understands strict return precautions if he develops severe shortness of breath prolonged hypoxia or any other concerning  symptoms he should return for evaluation.  Patient discharged.  This chart was dictated using voice recognition software.  Despite best efforts to proofread,  errors can occur which can change the documentation meaning.      Final diagnoses:  Community acquired pneumonia, unspecified laterality  Mild asthma with exacerbation, unspecified whether persistent    ED Discharge Orders          Ordered    amoxicillin -clavulanate (AUGMENTIN ) 875-125 MG tablet  Every 12 hours        01/07/24 2023    doxycycline  (VIBRAMYCIN ) 100 MG capsule  2  times daily        01/07/24 2023    predniSONE  (DELTASONE ) 20 MG tablet  Daily        01/07/24 2023               Ruthe Cornet, DO 01/07/24 2025  "

## 2024-01-21 ENCOUNTER — Other Ambulatory Visit: Payer: Self-pay

## 2024-01-21 ENCOUNTER — Emergency Department (HOSPITAL_BASED_OUTPATIENT_CLINIC_OR_DEPARTMENT_OTHER)
Admission: EM | Admit: 2024-01-21 | Discharge: 2024-01-21 | Disposition: A | Discharge status: Home / Self Care | Attending: Emergency Medicine | Admitting: Emergency Medicine

## 2024-01-21 ENCOUNTER — Emergency Department (HOSPITAL_BASED_OUTPATIENT_CLINIC_OR_DEPARTMENT_OTHER): Admitting: Radiology

## 2024-01-21 ENCOUNTER — Encounter (HOSPITAL_BASED_OUTPATIENT_CLINIC_OR_DEPARTMENT_OTHER): Payer: Self-pay

## 2024-01-21 DIAGNOSIS — R0982 Postnasal drip: Secondary | ICD-10-CM | POA: Diagnosis not present

## 2024-01-21 DIAGNOSIS — R0781 Pleurodynia: Secondary | ICD-10-CM | POA: Diagnosis not present

## 2024-01-21 DIAGNOSIS — R052 Subacute cough: Secondary | ICD-10-CM | POA: Insufficient documentation

## 2024-01-21 DIAGNOSIS — R509 Fever, unspecified: Secondary | ICD-10-CM | POA: Diagnosis not present

## 2024-01-21 DIAGNOSIS — R059 Cough, unspecified: Secondary | ICD-10-CM | POA: Diagnosis present

## 2024-01-21 LAB — CBC WITH DIFFERENTIAL/PLATELET
Abs Immature Granulocytes: 0.1 K/uL — ABNORMAL HIGH (ref 0.00–0.07)
Basophils Absolute: 0 K/uL (ref 0.0–0.1)
Basophils Relative: 0 %
Eosinophils Absolute: 0.2 K/uL (ref 0.0–0.5)
Eosinophils Relative: 1 %
HCT: 42.4 % (ref 39.0–52.0)
Hemoglobin: 13.6 g/dL (ref 13.0–17.0)
Immature Granulocytes: 1 %
Lymphocytes Relative: 16 %
Lymphs Abs: 2 K/uL (ref 0.7–4.0)
MCH: 27.7 pg (ref 26.0–34.0)
MCHC: 32.1 g/dL (ref 30.0–36.0)
MCV: 86.4 fL (ref 80.0–100.0)
Monocytes Absolute: 1.2 K/uL — ABNORMAL HIGH (ref 0.1–1.0)
Monocytes Relative: 10 %
Neutro Abs: 9.1 K/uL — ABNORMAL HIGH (ref 1.7–7.7)
Neutrophils Relative %: 72 %
Platelets: 138 K/uL — ABNORMAL LOW (ref 150–400)
RBC: 4.91 MIL/uL (ref 4.22–5.81)
RDW: 16 % — ABNORMAL HIGH (ref 11.5–15.5)
WBC: 12.6 K/uL — ABNORMAL HIGH (ref 4.0–10.5)
nRBC: 0 % (ref 0.0–0.2)

## 2024-01-21 LAB — COMPREHENSIVE METABOLIC PANEL WITH GFR
ALT: 50 U/L — ABNORMAL HIGH (ref 0–44)
AST: 37 U/L (ref 15–41)
Albumin: 4 g/dL (ref 3.5–5.0)
Alkaline Phosphatase: 101 U/L (ref 38–126)
Anion gap: 12 (ref 5–15)
BUN: 17 mg/dL (ref 8–23)
CO2: 24 mmol/L (ref 22–32)
Calcium: 9.4 mg/dL (ref 8.9–10.3)
Chloride: 102 mmol/L (ref 98–111)
Creatinine, Ser: 1.41 mg/dL — ABNORMAL HIGH (ref 0.61–1.24)
GFR, Estimated: 53 mL/min — ABNORMAL LOW
Glucose, Bld: 124 mg/dL — ABNORMAL HIGH (ref 70–99)
Potassium: 4.3 mmol/L (ref 3.5–5.1)
Sodium: 137 mmol/L (ref 135–145)
Total Bilirubin: 0.6 mg/dL (ref 0.0–1.2)
Total Protein: 6.8 g/dL (ref 6.5–8.1)

## 2024-01-21 MED ORDER — CETIRIZINE HCL 10 MG PO TABS: 10.0000 mg | ORAL_TABLET | Freq: Every day | ORAL | 0 refills | Status: AC

## 2024-01-21 MED ORDER — HYDROCOD POLI-CHLORPHE POLI ER 10-8 MG/5ML PO SUER: 5.0000 mL | Freq: Two times a day (BID) | ORAL | 0 refills | Status: AC

## 2024-01-21 MED ORDER — FLUTICASONE PROPIONATE 50 MCG/ACT NA SUSP: 1.0000 | Freq: Every day | NASAL | 1 refills | Status: AC

## 2024-01-21 MED ORDER — HYDROCOD POLI-CHLORPHE POLI ER 10-8 MG/5ML PO SUER
5.0000 mL | Freq: Once | ORAL | Status: AC
  Administered 2024-01-21: 5 mL via ORAL
  Filled 2024-01-21: qty 5

## 2024-01-21 NOTE — ED Triage Notes (Signed)
 Pt was seen here 1/4 dx with Community acquired pneumonia finished antibiotics 5 days ago.   Felt better then today began coughing and has pain rt. Upper lung area when couging Fever 100.5 today advil 1845

## 2024-01-21 NOTE — ED Provider Notes (Signed)
 " [place] EMERGENCY DEPARTMENT AT [facility] Provider Note   CSN: [id number] Arrival date & time: 01/21/24  1943     Patient presents with: Cough   [name] is a 74 y.o. male.   HPI   73 year old male presents emergency department with ongoing cough and right sided chest pain.  Patient was seen on January 4 with cough.  Found to have possible pneumonia of the left lower lung.  Finished outpatient oral course of antibiotics with other symptomatic treatment.  Felt improved.  However today began coughing again.  Has pinpoint right sided rib pain with coughing and certain movements.  Also reports low-grade fever 100.5.  Cough is nonproductive.  No hemoptysis, no unilateral leg swelling.  Admits to ongoing postnasal drip.  Prior to Admission medications  Medication Sig Start Date End Date Taking? Authorizing Provider  cetirizine  (ZYRTEC  ALLERGY ) 10 MG tablet Take 1 tablet (10 mg total) by mouth daily. 01/21/24  Yes Chin Wachter, Roxie HERO, DO  chlorpheniramine-HYDROcodone (TUSSIONEX) 10-8 MG/5ML Take 5 mLs by mouth 2 (two) times daily. 01/21/24  Yes Ashiah Karpowicz M, DO  fluticasone  (FLONASE ) 50 MCG/ACT nasal spray Place 1 spray into both nostrils daily. 01/21/24  Yes Jajaira Ruis, Roxie HERO, DO  acetaminophen  (TYLENOL  8 HOUR) 650 MG CR tablet Take 1 tablet (650 mg total) by mouth every 6 (six) hours as needed for pain. 10/19/22   Charlyn Sora, MD  allopurinol (ZYLOPRIM) 100 MG tablet Take 100 mg by mouth daily.      [provider]  atenolol (TENORMIN) 100 MG tablet Take 100 mg by mouth daily.      [provider]  cloNIDine (CATAPRES) 0.1 MG tablet Take 0.1 mg by mouth. TAKES ONE AND HALF TABLET DAILY    [provider]  doxycycline  (VIBRAMYCIN ) 100 MG capsule Take 1 capsule (100 mg total) by mouth 2 (two) times daily. 01/07/24   Curatolo, Adam, DO  fluticasone  (FLOVENT  HFA) 110 MCG/ACT inhaler Inhale 2 puffs into the lungs daily. 08/10/16   Alva, Rakesh V, MD   ibuprofen (ADVIL,MOTRIN) 200 MG tablet Take 200 mg by mouth every 6 (six) hours as needed.      [provider]  omeprazole (PRILOSEC) 10 MG capsule Take 10 mg by mouth daily.    [provider]  oxyCODONE -acetaminophen  (PERCOCET/ROXICET) 5-325 MG tablet Take 1 tablet by mouth every 8 (eight) hours as needed for severe pain (pain score 7-10). 10/19/22   Charlyn Sora, MD  simvastatin (ZOCOR) 20 MG tablet Take 20 mg by mouth at bedtime.      [provider]    Allergies: Patient has no known allergies.    Review of Systems  Constitutional:  Negative for fever.  HENT:  Positive for congestion and postnasal drip.   Respiratory:  Positive for cough. Negative for chest tightness and shortness of breath.   Cardiovascular:  Positive for chest pain. Negative for palpitations and leg swelling.  Gastrointestinal:  Negative for abdominal pain, diarrhea and vomiting.  Musculoskeletal:  Negative for back pain.  Skin:  Negative for rash.  Neurological:  Negative for headaches.    Updated Vital Signs BP (!) 110/58   Pulse (!) 58   Temp 98.5 F (36.9 C) (Oral)   Resp 18   Ht 5' 6 (1.676 m)   Wt 84.4 kg   SpO2 96%   BMI 30.02 kg/m   Physical Exam Vitals and nursing note reviewed.  Constitutional:      General: He is not  in acute distress.    Appearance: Normal appearance. He is not ill-appearing.  HENT:     Head: Normocephalic.     Mouth/Throat:     Mouth: Mucous membranes are moist.  Cardiovascular:     Rate and Rhythm: Normal rate.  Pulmonary:     Effort: Pulmonary effort is normal. No respiratory distress.  Abdominal:     Palpations: Abdomen is soft.     Tenderness: There is no abdominal tenderness.  Musculoskeletal:        General: No swelling.  Skin:    General: Skin is warm.  Neurological:     Mental Status: He is alert and oriented to person, place, and time. Mental status is at baseline.  Psychiatric:        Mood and Affect: Mood normal.      (all labs ordered are listed, but only abnormal results are displayed) Labs Reviewed  CBC WITH DIFFERENTIAL/PLATELET - Abnormal; Notable for the following components:      Result Value   WBC 12.6 (*)    RDW 16.0 (*)    Platelets 138 (*)    Neutro Abs 9.1 (*)    Monocytes Absolute 1.2 (*)    Abs Immature Granulocytes 0.10 (*)    All other components within normal limits  COMPREHENSIVE METABOLIC PANEL WITH GFR - Abnormal; Notable for the following components:   Glucose, Bld 124 (*)    Creatinine, Ser 1.41 (*)    ALT 50 (*)    GFR, Estimated 53 (*)    All other components within normal limits    EKG: None  Radiology: DG Chest 2 View Result Date: 01/21/2024 EXAM: 2 VIEW(S) XRAY OF THE CHEST 01/21/2024 08:17:58 PM COMPARISON: 01/07/2024 CLINICAL HISTORY: cough cough cough cough cough FINDINGS: LUNGS AND PLEURA: Similar left basilar airspace opacities. No pleural effusion. No pneumothorax. HEART AND MEDIASTINUM: No acute abnormality of the cardiac and mediastinal silhouettes. BONES AND SOFT TISSUES: Right upper quadrant surgical clips noted. Right proximal humerus ORIF noted. Thoracic spine degenerative changes. IMPRESSION: 1. Similar left basilar airspace opacities. Electronically signed by: Greig Pique MD 01/21/2024 08:28 PM EST RP Workstation: [id number]     Procedures   Medications Ordered in the ED  chlorpheniramine-HYDROcodone (TUSSIONEX) 10-8 MG/5ML suspension 5 mL (has no administration in time range)                                    Medical Decision Making Amount and/or Complexity of Data Reviewed Labs: ordered. Radiology: ordered.  Risk OTC drugs. Prescription drug management.   74 year old male presents emergency department with cough, right sided chest pain.  Previously diagnosed with pneumonia 14 days ago, completed outpatient antibiotics with improvement.  Reports low-grade fever.  Nonproductive cough, no hemoptysis or leg swelling.  Vitals are  normal and stable.  Patient is sitting up, very well-appearing.  When he takes a deep breath or cough he does have noticeable right sided chest pain that is reproducible on exam.  Otherwise lung sounds are clear.  Blood work has a mild leukocytosis, he did recently finished an outpatient prednisone  course.  Otherwise his baseline.  Chest x-ray shows ongoing left basilar opacity/atelectasis, consistent with findings 14 days ago.  I doubt PE at this time.  There is no tachycardia, tachypnea or hypoxia.  The right-sided chest pain appears musculoskeletal.  I believe patient's symptoms could be viral.  I do not feel that this is  unresolved pneumonia given that the chest x-ray is unchanged and favors atelectasis.  Discussed with the patient symptomatic treatment, specifically for postnasal drip.  Will also include cough medicine with opiate for pain relief and cough control.  Discussed red flag symptoms to return for and he understands.  He will otherwise schedule an appoint with his primary doctor.  Patient at this time appears safe and stable for discharge and close outpatient follow up. Discharge plan and strict return to ED precautions discussed, patient verbalizes understanding and agreement.       Final diagnoses:  Subacute cough    ED Discharge Orders          Ordered    chlorpheniramine-HYDROcodone (TUSSIONEX) 10-8 MG/5ML  2 times daily        01/21/24 2313    cetirizine  (ZYRTEC  ALLERGY ) 10 MG tablet  Daily        01/21/24 2313    fluticasone  (FLONASE ) 50 MCG/ACT nasal spray  Daily        01/21/24 2313               Amity Roes M, DO 01/21/24 2347  "

## 2024-01-21 NOTE — Discharge Instructions (Addendum)
 You have been seen and discharged from the emergency department.  Your blood work and chest x-ray are unchanged today.  We are going to treat your cough and pain symptomatically.  Use cough medicine for cough/pain relief.  Take a daily Zyrtec  to decrease postnasal drip.  Use Flonase  to decrease postnasal drip.  Continue to use inhalers as directed.  Stay well-hydrated.  Follow-up with your primary provider for further evaluation and further care.  Call their office tomorrow to schedule an outpatient follow-up appointment.  Take home medications as prescribed. If you have any worsening symptoms, high fevers, productive cough/coughing up blood, unilateral leg swelling or further concerns for your health please return to an emergency department for further evaluation.
